# Patient Record
Sex: Female | Born: 1975 | Race: Black or African American | Hispanic: No | Marital: Single | Smoking: Current every day smoker
Health system: Southern US, Community
[De-identification: ages and names within clinical notes are randomized; demographics above are authoritative.]

## PROBLEM LIST (undated history)

## (undated) DIAGNOSIS — A630 Anogenital (venereal) warts: Secondary | ICD-10-CM

## (undated) DIAGNOSIS — F319 Bipolar disorder, unspecified: Secondary | ICD-10-CM

## (undated) DIAGNOSIS — N946 Dysmenorrhea, unspecified: Secondary | ICD-10-CM

## (undated) DIAGNOSIS — G43909 Migraine, unspecified, not intractable, without status migrainosus: Secondary | ICD-10-CM

## (undated) DIAGNOSIS — I7300 Raynaud's syndrome without gangrene: Secondary | ICD-10-CM

## (undated) DIAGNOSIS — F32A Depression, unspecified: Secondary | ICD-10-CM

## (undated) DIAGNOSIS — I10 Essential (primary) hypertension: Secondary | ICD-10-CM

## (undated) DIAGNOSIS — F329 Major depressive disorder, single episode, unspecified: Secondary | ICD-10-CM

## (undated) DIAGNOSIS — F419 Anxiety disorder, unspecified: Secondary | ICD-10-CM

## (undated) DIAGNOSIS — M797 Fibromyalgia: Secondary | ICD-10-CM

## (undated) DIAGNOSIS — E7800 Pure hypercholesterolemia, unspecified: Secondary | ICD-10-CM

## (undated) HISTORY — PX: TUBAL LIGATION: SHX77

## (undated) HISTORY — DX: Anxiety disorder, unspecified: F41.9

## (undated) HISTORY — DX: Major depressive disorder, single episode, unspecified: F32.9

## (undated) HISTORY — DX: Dysmenorrhea, unspecified: N94.6

## (undated) HISTORY — PX: COLPOSCOPY: SHX161

## (undated) HISTORY — DX: Depression, unspecified: F32.A

## (undated) HISTORY — DX: Anogenital (venereal) warts: A63.0

---

## 1999-09-30 HISTORY — PX: CHOLECYSTECTOMY: SHX55

## 2009-07-30 ENCOUNTER — Emergency Department (HOSPITAL_COMMUNITY): Admission: EM | Admit: 2009-07-30 | Discharge: 2009-07-30 | Payer: Self-pay | Admitting: Emergency Medicine

## 2009-08-01 ENCOUNTER — Emergency Department (HOSPITAL_COMMUNITY): Admission: EM | Admit: 2009-08-01 | Discharge: 2009-08-01 | Payer: Self-pay | Admitting: Emergency Medicine

## 2010-10-08 ENCOUNTER — Encounter: Admission: RE | Admit: 2010-10-08 | Discharge: 2010-10-08 | Payer: Self-pay | Admitting: Family Medicine

## 2012-04-24 ENCOUNTER — Emergency Department (HOSPITAL_COMMUNITY)
Admission: EM | Admit: 2012-04-24 | Discharge: 2012-04-24 | Disposition: A | Discharge status: Home / Self Care | Payer: Self-pay | Attending: Emergency Medicine | Admitting: Emergency Medicine

## 2012-04-24 ENCOUNTER — Emergency Department (HOSPITAL_COMMUNITY): Payer: Self-pay

## 2012-04-24 ENCOUNTER — Encounter (HOSPITAL_COMMUNITY): Payer: Self-pay | Admitting: Adult Health

## 2012-04-24 DIAGNOSIS — R42 Dizziness and giddiness: Secondary | ICD-10-CM | POA: Insufficient documentation

## 2012-04-24 DIAGNOSIS — R200 Anesthesia of skin: Secondary | ICD-10-CM

## 2012-04-24 DIAGNOSIS — R209 Unspecified disturbances of skin sensation: Secondary | ICD-10-CM | POA: Insufficient documentation

## 2012-04-24 DIAGNOSIS — R079 Chest pain, unspecified: Secondary | ICD-10-CM | POA: Insufficient documentation

## 2012-04-24 HISTORY — DX: Essential (primary) hypertension: I10

## 2012-04-24 LAB — CBC
HCT: 39.6 % (ref 36.0–46.0)
Hemoglobin: 13.9 g/dL (ref 12.0–15.0)
MCH: 33.7 pg (ref 26.0–34.0)
MCHC: 35.1 g/dL (ref 30.0–36.0)
MCV: 95.9 fL (ref 78.0–100.0)
Platelets: 399 10*3/uL (ref 150–400)
RBC: 4.13 MIL/uL (ref 3.87–5.11)
RDW: 12.3 % (ref 11.5–15.5)
WBC: 8.1 10*3/uL (ref 4.0–10.5)

## 2012-04-24 LAB — GLUCOSE, CAPILLARY: Glucose-Capillary: 95 mg/dL (ref 70–99)

## 2012-04-24 LAB — BASIC METABOLIC PANEL
BUN: 8 mg/dL (ref 6–23)
CO2: 25 mEq/L (ref 19–32)
Calcium: 9.3 mg/dL (ref 8.4–10.5)
Chloride: 101 mEq/L (ref 96–112)
Creatinine, Ser: 0.74 mg/dL (ref 0.50–1.10)
GFR calc Af Amer: >90 mL/min (ref >90)
GFR calc non Af Amer: >90 mL/min (ref >90)
Glucose, Bld: 118 mg/dL — ABNORMAL HIGH (ref 70–99)
Potassium: 4.3 mEq/L (ref 3.5–5.1)
Sodium: 136 mEq/L (ref 135–145)

## 2012-04-24 LAB — POCT I-STAT TROPONIN I: Troponin i, poc: 0 ng/mL (ref 0.00–0.08)

## 2012-04-24 NOTE — Discharge Instructions (Signed)
Chest Pain (Nonspecific) It is often hard to give a specific diagnosis for the cause of chest pain. There is always a chance that your pain could be related to something serious, such as a heart attack or a blood clot in the lungs. You need to follow up with your caregiver for further evaluation. CAUSES   Heartburn.   Pneumonia or bronchitis.   Anxiety or stress.   Inflammation around your heart (pericarditis) or lung (pleuritis or pleurisy).   A blood clot in the lung.   A collapsed lung (pneumothorax). It can develop suddenly on its own (spontaneous pneumothorax) or from injury (trauma) to the chest.   Shingles infection (herpes zoster virus).  The chest wall is composed of bones, muscles, and cartilage. Any of these can be the source of the pain.  The bones can be bruised by injury.   The muscles or cartilage can be strained by coughing or overwork.   The cartilage can be affected by inflammation and become sore (costochondritis).  DIAGNOSIS  Lab tests or other studies, such as X-rays, electrocardiography, stress testing, or cardiac imaging, may be needed to find the cause of your pain.  TREATMENT   Treatment depends on what may be causing your chest pain. Treatment may include:   Acid blockers for heartburn.   Anti-inflammatory medicine.   Pain medicine for inflammatory conditions.   Antibiotics if an infection is present.   You may be advised to change lifestyle habits. This includes stopping smoking and avoiding alcohol, caffeine, and chocolate.   You may be advised to keep your head raised (elevated) when sleeping. This reduces the chance of acid going backward from your stomach into your esophagus.   Most of the time, nonspecific chest pain will improve within 2 to 3 days with rest and mild pain medicine.  HOME CARE INSTRUCTIONS   If antibiotics were prescribed, take your antibiotics as directed. Finish them even if you start to feel better.   For the next few  days, avoid physical activities that bring on chest pain. Continue physical activities as directed.   Do not smoke.   Avoid drinking alcohol.   Only take over-the-counter or prescription medicine for pain, discomfort, or fever as directed by your caregiver.   Follow your caregiver's suggestions for further testing if your chest pain does not go away.   Keep any follow-up appointments you made. If you do not go to an appointment, you could develop lasting (chronic) problems with pain. If there is any problem keeping an appointment, you must call to reschedule.  SEEK MEDICAL CARE IF:   You think you are having problems from the medicine you are taking. Read your medicine instructions carefully.   Your chest pain does not go away, even after treatment.   You develop a rash with blisters on your chest.  SEEK IMMEDIATE MEDICAL CARE IF:   You have increased chest pain or pain that spreads to your arm, neck, jaw, back, or abdomen.   You develop shortness of breath, an increasing cough, or you are coughing up blood.   You have severe back or abdominal pain, feel nauseous, or vomit.   You develop severe weakness, fainting, or chills.   You have a fever.  THIS IS AN EMERGENCY. Do not wait to see if the pain will go away. Get medical help at once. Call your local emergency services (911 in [place] Do not drive yourself to the hospital. MAKE SURE YOU:   Understand these instructions.     Will watch your condition.   Will get help right away if you are not doing well or get worse.  Document Released: 08/25/2005 Document Revised: 11/04/2011 Document Reviewed: 06/20/2008 ExitCare Patient Information 2012 [employer]  RESOURCE GUIDE  Dental Problems  Patients with Medicaid: [facility]                     [facility] [address]                                           [address] Phone:  [phone]                                                   Phone:  [phone]  If unable to pay or uninsured, contact:  [facility] or [facility] to become qualified for the adult dental clinic.  Chronic Pain Problems Contact [facility]  [phone] Patients need to be referred by their primary care doctor.  Insufficient Money for Medicine Contact [employer]:  call "211" or [facility] 271-5999.  No Primary Care Doctor Call [facility]  832-8000 Other agencies that provide inexpensive medical care    [facility]  [phone]    [facility]  [phone]    [facility]  [phone]    [facility]  832-4777    [facility]  [phone]    [facility]  [phone]  Psychological Services [facility]  [phone] [facility]  [phone] [facility]   800 853-5163 (emergency services 641-4993)  Substance Abuse Resources [facility]  336-882-2125 [facility] 336-784-9470 [facility] 336-285-9073 [facility] 336-845-3988 Residential & Outpatient Substance Abuse Program  800-659-3381  Abuse/Neglect [place] Child Abuse Hotline (336) 641-3795 [place] Child Abuse Hotline 800-378-5315 (After Hours)  Emergency Shelter [facility] (336) 271-5985  [facility] (336) 275-9566 [facility] (704) 372-4663  MRSA Hotline #:   832-7006    [place] [facility]  [facility] [place]     [employer]                          [facility] [address] [place]                       [address]      [address]  [place]                                                [place]                            [place] Phone:  [phone]                                     Phone:  [phone]                 Phone:  [phone]  [facility] Phone:   [phone]  [place] Child Abuse Hotline (336) 342-1394 (336) 342-3537 (After Hours)   

## 2012-04-24 NOTE — ED Notes (Addendum)
Reports left arm numbness that radiates to left neck and left face that began last night and worsened this AM at 9 am, pt also complains of tightness in left side of chest that radiates to the mid back and is reproducable with touch  And deep inspiration associated with no appetite. Face symetrical, no drfit or droop, speech clear.

## 2012-04-24 NOTE — ED Notes (Signed)
Pt. Reports left sided pain from "top of my head to my pant line".  States that she woke up and noticed left arm numbness. States that "I just don't feel right".  Pt. Also states she has been under a lot of stress lately.  Reports similar pain in the past due to stress but has never been this bad.

## 2012-04-24 NOTE — ED Notes (Signed)
Pt. Alert and oriented X4, sitting up in bed texting.

## 2012-04-24 NOTE — ED Notes (Signed)
X-ray at bedside

## 2012-04-24 NOTE — ED Notes (Signed)
Dr. Kohut at bedside 

## 2012-04-30 NOTE — ED Provider Notes (Signed)
History    35yF with intermittent numbness of L arm, face and neck. Onset last night. Lasts minutes to up to an hour. Worse today. No appreciable exacerbating or relieving factors. Mild CP. L sided with radiation to back. Worse with respiration. No fever or chills. Denies trauma. No rash. No weakness. NO visual complaints. No n/v. No problems with balance.  CSN: [id number]  Arrival date & time 04/24/12  1620   First MD Initiated Contact with Patient 04/24/12 1733      Chief Complaint  Patient presents with  . Chest Pain  . Numbness  . Headache    (Consider location/radiation/quality/duration/timing/severity/associated sxs/prior treatment) HPI  Past Medical History  Diagnosis Date  . Diabetes mellitus   . Hypertension     History reviewed. No pertinent past surgical history.  History reviewed. No pertinent family history.  History  Substance Use Topics  . Smoking status: Current Everyday Smoker  . Smokeless tobacco: Not on file  . Alcohol Use: No    OB History    Grav Para Term Preterm Abortions TAB SAB Ect Mult Living                  Review of Systems   Review of symptoms negative unless otherwise noted in HPI.   Allergies  Review of patient's allergies indicates no known allergies.  Home Medications  No current outpatient prescriptions on file.  BP 123/96  Pulse 71  Temp(Src) 98.4 F (36.9 C) (Oral)  Resp 12  SpO2 100%  LMP 04/21/2012  Physical Exam  Nursing note and vitals reviewed. Constitutional: She is oriented to person, place, and time. She appears well-developed and well-nourished. No distress.  HENT:  Head: Normocephalic and atraumatic.  Eyes: Conjunctivae are normal. Right eye exhibits no discharge. Left eye exhibits no discharge.  Neck: Neck supple.  Cardiovascular: Normal rate, regular rhythm and normal heart sounds.  Exam reveals no gallop and no friction rub.   No murmur heard. Pulmonary/Chest: Effort normal and breath sounds  normal. No respiratory distress.  Abdominal: Soft. She exhibits no distension. There is no tenderness.  Musculoskeletal: She exhibits no edema and no tenderness.  Neurological: She is alert and oriented to person, place, and time. She displays normal reflexes. No cranial nerve deficit. She exhibits normal muscle tone. Coordination normal.       Good finger to nose. Gait steady  Skin: Skin is warm and dry. She is not diaphoretic.  Psychiatric: She has a normal mood and affect. Her behavior is normal. Thought content normal.    ED Course  Procedures (including critical care time)  Labs Reviewed  BASIC METABOLIC PANEL - Abnormal; Notable for the following:    Glucose, Bld 118 (*)    All other components within normal limits  GLUCOSE, CAPILLARY - Abnormal; Notable for the following:    Glucose-Capillary 110 (*)    All other components within normal limits  CBC  POCT I-STAT TROPONIN I  GLUCOSE, CAPILLARY  LAB REPORT - SCANNED   No results found.  Ct Head Wo Contrast  04/24/2012  *RADIOLOGY REPORT*  Clinical Data: Left arm numbness.  CT HEAD WITHOUT CONTRAST  Technique:  Contiguous axial images were obtained from the base of the skull through the vertex without contrast.  Comparison: None.  Findings: There is no evidence for acute infarction, intracranial hemorrhage, mass lesion, hydrocephalus, or extra-axial fluid. There is no atrophy or white matter disease.  Calvarium is intact. The mastoids and sinuses are clear.  IMPRESSION: Negative  exam.  Original Report Authenticated By: Elsie Stain, M.D.   Chest Portable 1 View  04/24/2012  *RADIOLOGY REPORT*  Clinical Data: Chest pain, diabetic, smoker.  CHEST - 1 VIEW  Comparison:  None.  Findings: The heart size and mediastinal contours are within normal limits.  Both lungs are clear.  IMPRESSION: No active disease.  Original Report Authenticated By: Elsie Stain, M.D.   EKG:  Rhythm: sinus tach Rate: 100 Axis: normal Intervals:  normal ST segments: NS ST changes  1. Chest pain   2. Numbness       MDM  35yF with vague symptoms of numbness. nonfocal exam. CT head neg. EKG nonprovocative and trop wnl. Doubt dissection or pe. Low suspicion for emergent process. Return precautions discussed. Outpt fu.        Raeford Razor, MD 04/30/12 [phone]

## 2013-04-06 ENCOUNTER — Encounter: Payer: Self-pay | Admitting: Obstetrics

## 2013-07-04 ENCOUNTER — Ambulatory Visit: Payer: Self-pay | Admitting: Obstetrics

## 2013-08-19 ENCOUNTER — Emergency Department (HOSPITAL_COMMUNITY)
Admission: EM | Admit: 2013-08-19 | Discharge: 2013-08-19 | Disposition: A | Discharge status: Home / Self Care | Payer: 59 | Attending: Emergency Medicine | Admitting: Emergency Medicine

## 2013-08-19 ENCOUNTER — Encounter (HOSPITAL_COMMUNITY): Payer: Self-pay | Admitting: Emergency Medicine

## 2013-08-19 ENCOUNTER — Emergency Department (HOSPITAL_COMMUNITY): Payer: 59

## 2013-08-19 DIAGNOSIS — I10 Essential (primary) hypertension: Secondary | ICD-10-CM | POA: Insufficient documentation

## 2013-08-19 DIAGNOSIS — Z79899 Other long term (current) drug therapy: Secondary | ICD-10-CM | POA: Insufficient documentation

## 2013-08-19 DIAGNOSIS — R0789 Other chest pain: Secondary | ICD-10-CM | POA: Insufficient documentation

## 2013-08-19 DIAGNOSIS — E7800 Pure hypercholesterolemia, unspecified: Secondary | ICD-10-CM | POA: Insufficient documentation

## 2013-08-19 DIAGNOSIS — F17200 Nicotine dependence, unspecified, uncomplicated: Secondary | ICD-10-CM | POA: Insufficient documentation

## 2013-08-19 DIAGNOSIS — E119 Type 2 diabetes mellitus without complications: Secondary | ICD-10-CM | POA: Insufficient documentation

## 2013-08-19 DIAGNOSIS — R079 Chest pain, unspecified: Secondary | ICD-10-CM

## 2013-08-19 HISTORY — DX: Pure hypercholesterolemia, unspecified: E78.00

## 2013-08-19 LAB — COMPREHENSIVE METABOLIC PANEL
ALT: 9 U/L (ref 0–35)
Alkaline Phosphatase: 80 U/L (ref 39–117)
BUN: 8 mg/dL (ref 6–23)
Calcium: 9.2 mg/dL (ref 8.4–10.5)
Creatinine, Ser: 0.68 mg/dL (ref 0.50–1.10)
GFR calc Af Amer: >90 mL/min (ref >90)
Glucose, Bld: 131 mg/dL — ABNORMAL HIGH (ref 70–99)
Sodium: 133 mEq/L — ABNORMAL LOW (ref 135–145)
Total Protein: 7.9 g/dL (ref 6.0–8.3)

## 2013-08-19 LAB — CBC WITH DIFFERENTIAL/PLATELET
Eosinophils Absolute: 0.4 10*3/uL (ref 0.0–0.7)
Eosinophils Relative: 6 % — ABNORMAL HIGH (ref 0–5)
HCT: 39.3 % (ref 36.0–46.0)
Lymphs Abs: 2.7 10*3/uL (ref 0.7–4.0)
MCH: 32.8 pg (ref 26.0–34.0)
MCV: 96.3 fL (ref 78.0–100.0)
Monocytes Absolute: 0.5 10*3/uL (ref 0.1–1.0)
Monocytes Relative: 7 % (ref 3–12)
Neutro Abs: 3.6 10*3/uL (ref 1.7–7.7)
Platelets: 361 10*3/uL (ref 150–400)
RBC: 4.08 MIL/uL (ref 3.87–5.11)

## 2013-08-19 LAB — LIPASE, BLOOD: Lipase: 24 U/L (ref 11–59)

## 2013-08-19 LAB — TROPONIN I: Troponin I: <0.3 ng/mL (ref <0.30)

## 2013-08-19 MED ORDER — HYDROCODONE-ACETAMINOPHEN 5-325 MG PO TABS: 1.0000 | ORAL_TABLET | ORAL | Status: DC | PRN

## 2013-08-19 MED ORDER — GI COCKTAIL ~~LOC~~
30.0000 mL | Freq: Once | ORAL | Status: AC
  Administered 2013-08-19: 30 mL via ORAL
  Filled 2013-08-19: qty 30

## 2013-08-19 NOTE — ED Notes (Signed)
Pt here with left sided CP x months worse over last 2 weeks with pain into back and abd area

## 2013-08-19 NOTE — ED Provider Notes (Signed)
Medical screening examination/treatment/procedure(s) were performed by non-physician practitioner and as supervising physician I was immediately available for consultation/collaboration.   Junius Argyle, MD 08/19/13 [phone]

## 2013-08-19 NOTE — ED Provider Notes (Signed)
CSN: [id number]     Arrival date & time 08/19/13  1038 History   First MD Initiated Contact with Patient 08/19/13 1111     Chief Complaint  Patient presents with  . Chest Pain   (Consider location/radiation/quality/duration/timing/severity/associated sxs/prior Treatment) Patient is a 37 y.o. female presenting with chest pain. The history is provided by the patient. No language interpreter was used.  Chest Pain Pain location:  L chest Pain quality: sharp   Associated symptoms: abdominal pain and nausea   Associated symptoms: no cough, no fever, no shortness of breath and not vomiting   Associated symptoms comment:  Sharp chest pain that started suddenly while in the shower this morning affecting the left side of her chest. It has been continuous, now with pain in the epigastric and LUQ abdomen. She reports nausea without vomiting. No recent fever. She states she has had this pain intermittently in the past months but this was the most intense episode.    Past Medical History  Diagnosis Date  . Diabetes mellitus   . Hypertension   . Hypercholesteremia    History reviewed. No pertinent past surgical history. History reviewed. No pertinent family history. History  Substance Use Topics  . Smoking status: Current Every Day Smoker  . Smokeless tobacco: Not on file  . Alcohol Use: No   OB History   Grav Para Term Preterm Abortions TAB SAB Ect Mult Living                 Review of Systems  Constitutional: Negative for fever.  Respiratory: Negative for cough and shortness of breath.   Cardiovascular: Positive for chest pain.  Gastrointestinal: Positive for nausea and abdominal pain. Negative for vomiting.  Genitourinary: Negative.   Musculoskeletal: Negative.   Skin: Negative.   Neurological: Negative.     Allergies  Review of patient's allergies indicates no known allergies.  Home Medications   Current Outpatient Rx  Name  Route  Sig  Dispense  Refill  . baclofen  (LIORESAL) 10 MG tablet   Oral   Take 10 mg by mouth 3 times/day as needed-between meals & bedtime (for back pain).         Marland Kitchen lisinopril (PRINIVIL,ZESTRIL) 10 MG tablet   Oral   Take 20 mg by mouth daily.         . metFORMIN (GLUCOPHAGE) 500 MG tablet   Oral   Take 500 mg by mouth 2 (two) times daily with a meal.         . naproxen sodium (ANAPROX) 220 MG tablet   Oral   Take 220 mg by mouth 2 (two) times daily with a meal.         . omeprazole (PRILOSEC) 20 MG capsule   Oral   Take 20 mg by mouth daily.         . pravastatin (PRAVACHOL) 80 MG tablet   Oral   Take 80 mg by mouth daily.         . Probiotic Product (PROBIOTIC DAILY PO)   Oral   Take 1 capsule by mouth daily.          BP 134/89  Pulse 84  Temp(Src) 98.4 F (36.9 C) (Oral)  Resp 18  SpO2 98% Physical Exam  Constitutional: She is oriented to person, place, and time. She appears well-developed and well-nourished.  HENT:  Head: Normocephalic.  Neck: Normal range of motion. Neck supple.  Cardiovascular: Normal rate and regular rhythm.   Pulmonary/Chest: Effort  normal and breath sounds normal. She has no wheezes. She has no rales. She exhibits no tenderness.  Abdominal: Soft. Bowel sounds are normal. There is no tenderness. There is no rebound and no guarding.  Musculoskeletal: Normal range of motion. She exhibits no edema.  Neurological: She is alert and oriented to person, place, and time.  Skin: Skin is warm and dry. No rash noted.  Psychiatric: She has a normal mood and affect.    ED Course  Procedures (including critical care time) Labs Review Labs Reviewed  TROPONIN I  CBC WITH DIFFERENTIAL  COMPREHENSIVE METABOLIC PANEL  LIPASE, BLOOD   Results for orders placed during the hospital encounter of 08/19/13  TROPONIN I      Result Value Range   Troponin I <0.30  <0.30 ng/mL  CBC WITH DIFFERENTIAL      Result Value Range   WBC 7.2  4.0 - 10.5 K/uL   RBC 4.08  3.87 - 5.11 MIL/uL    Hemoglobin 13.4  12.0 - 15.0 g/dL   HCT 95.2  84.1 - 32.4 %   MCV 96.3  78.0 - 100.0 fL   MCH 32.8  26.0 - 34.0 pg   MCHC 34.1  30.0 - 36.0 g/dL   RDW 40.1  02.7 - 25.3 %   Platelets 361  150 - 400 K/uL   Neutrophils Relative % 50  43 - 77 %   Neutro Abs 3.6  1.7 - 7.7 K/uL   Lymphocytes Relative 37  12 - 46 %   Lymphs Abs 2.7  0.7 - 4.0 K/uL   Monocytes Relative 7  3 - 12 %   Monocytes Absolute 0.5  0.1 - 1.0 K/uL   Eosinophils Relative 6 (*) 0 - 5 %   Eosinophils Absolute 0.4  0.0 - 0.7 K/uL   Basophils Relative 1  0 - 1 %   Basophils Absolute 0.0  0.0 - 0.1 K/uL  COMPREHENSIVE METABOLIC PANEL      Result Value Range   Sodium 133 (*) 135 - 145 mEq/L   Potassium 4.4  3.5 - 5.1 mEq/L   Chloride 99  96 - 112 mEq/L   CO2 25  19 - 32 mEq/L   Glucose, Bld 131 (*) 70 - 99 mg/dL   BUN 8  6 - 23 mg/dL   Creatinine, Ser 6.64  0.50 - 1.10 mg/dL   Calcium 9.2  8.4 - 40.3 mg/dL   Total Protein 7.9  6.0 - 8.3 g/dL   Albumin 3.7  3.5 - 5.2 g/dL   AST 15  0 - 37 U/L   ALT 9  0 - 35 U/L   Alkaline Phosphatase 80  39 - 117 U/L   Total Bilirubin 0.3  0.3 - 1.2 mg/dL   GFR calc non Af Amer >90  >90 mL/min   GFR calc Af Amer >90  >90 mL/min  LIPASE, BLOOD      Result Value Range   Lipase 24  11 - 59 U/L   Dg Chest 2 View  08/19/2013   *RADIOLOGY REPORT*  Clinical Data: Chest pain  CHEST - 2 VIEW  Comparison: Apr 24, 2012  Findings: There is no focal infiltrate, pulmonary edema, or pleural effusion.  Mediastinal contour and cardiac silhouette are normal. The soft tissues and osseous structures are normal.  IMPRESSION: No acute cardiopulmonary disease identified.   Original Report Authenticated By: Sherian Rein, M.D.   Imaging Review No results found.  MDM  No diagnosis found. 1.  Chest pain  The chest pain is atypical presentation of cardiac symptoms - constant, sharp, radiation into epigastrium and LUQ. She has HTN and high cholesterol, but is felt low risk for ACS today. Nml EKG,  CXR, neg trop.. Stable for discharge.     Arnoldo Hooker, PA-C 08/19/13 1511

## 2013-08-19 NOTE — ED Notes (Signed)
Shari PA at bedside   

## 2013-08-19 NOTE — ED Notes (Signed)
Pt has been having intermittent central cp for a few months. Pt states she has been to pcp for pain and they have been referring her to rheumatologist to treat for fibromyalgia. Pt states her tx has not been working and over the last two weeks pain has been different from normal she has been having sob, and back pain. Pt currently rates pain 6/10 and describes pain as pressure.

## 2013-08-19 NOTE — ED Notes (Signed)
Discharge and follow up instructions reviewed. Pt verbalized understanding.  

## 2014-07-05 ENCOUNTER — Emergency Department (HOSPITAL_COMMUNITY)
Admission: EM | Admit: 2014-07-05 | Discharge: 2014-07-05 | Disposition: A | Discharge status: Home / Self Care | Payer: Managed Care, Other (non HMO) | Attending: Emergency Medicine | Admitting: Emergency Medicine

## 2014-07-05 ENCOUNTER — Encounter (HOSPITAL_COMMUNITY): Payer: Self-pay | Admitting: Emergency Medicine

## 2014-07-05 DIAGNOSIS — S40269A Insect bite (nonvenomous) of unspecified shoulder, initial encounter: Secondary | ICD-10-CM | POA: Insufficient documentation

## 2014-07-05 DIAGNOSIS — W57XXXA Bitten or stung by nonvenomous insect and other nonvenomous arthropods, initial encounter: Secondary | ICD-10-CM

## 2014-07-05 DIAGNOSIS — E7800 Pure hypercholesterolemia, unspecified: Secondary | ICD-10-CM | POA: Insufficient documentation

## 2014-07-05 DIAGNOSIS — I10 Essential (primary) hypertension: Secondary | ICD-10-CM | POA: Insufficient documentation

## 2014-07-05 DIAGNOSIS — E119 Type 2 diabetes mellitus without complications: Secondary | ICD-10-CM | POA: Insufficient documentation

## 2014-07-05 DIAGNOSIS — Z791 Long term (current) use of non-steroidal anti-inflammatories (NSAID): Secondary | ICD-10-CM | POA: Insufficient documentation

## 2014-07-05 DIAGNOSIS — Y9289 Other specified places as the place of occurrence of the external cause: Secondary | ICD-10-CM | POA: Insufficient documentation

## 2014-07-05 DIAGNOSIS — Y9389 Activity, other specified: Secondary | ICD-10-CM | POA: Insufficient documentation

## 2014-07-05 DIAGNOSIS — F17200 Nicotine dependence, unspecified, uncomplicated: Secondary | ICD-10-CM | POA: Insufficient documentation

## 2014-07-05 DIAGNOSIS — Z79899 Other long term (current) drug therapy: Secondary | ICD-10-CM | POA: Insufficient documentation

## 2014-07-05 MED ORDER — HYDROXYZINE HCL 25 MG PO TABS: 25.0000 mg | ORAL_TABLET | Freq: Four times a day (QID) | ORAL | Status: DC

## 2014-07-05 MED ORDER — HYDROXYZINE HCL 25 MG PO TABS
25.0000 mg | ORAL_TABLET | Freq: Once | ORAL | Status: AC
  Administered 2014-07-05: 25 mg via ORAL
  Filled 2014-07-05: qty 1

## 2014-07-05 MED ORDER — SULFAMETHOXAZOLE-TRIMETHOPRIM 800-160 MG PO TABS: 1.0000 | ORAL_TABLET | Freq: Two times a day (BID) | ORAL | Status: DC

## 2014-07-05 NOTE — ED Provider Notes (Signed)
CSN: [id number]     Arrival date & time 07/05/14  [id number] History   First MD Initiated Contact with Patient 07/05/14 0945     Chief Complaint  Patient presents with  . Insect Bite     (Consider location/radiation/quality/duration/timing/severity/associated sxs/prior Treatment) Patient is a 38 y.o. female presenting with animal bite. The history is provided by the patient.  Animal Bite Contact animal:  Insect Location:  Shoulder/arm Shoulder/arm injury location:  R upper arm Time since incident:  6 days Pain details:    Quality:  Aching and itching   Severity:  Mild   Timing:  Constant   Progression:  Worsening Incident location:  Home Provoked: unprovoked   Relieved by:  Nothing Worsened by:  Nothing tried Associated symptoms: rash   Associated symptoms: no fever, no numbness and no swelling     Past Medical History  Diagnosis Date  . Diabetes mellitus   . Hypertension   . Hypercholesteremia    History reviewed. No pertinent past surgical history. History reviewed. No pertinent family history. History  Substance Use Topics  . Smoking status: Current Every Day Smoker  . Smokeless tobacco: Not on file  . Alcohol Use: No   OB History   Grav Para Term Preterm Abortions TAB SAB Ect Mult Living                 Review of Systems  Constitutional: Negative for fever.  Skin: Positive for rash.  Neurological: Negative for numbness.  All other systems reviewed and are negative.     Allergies  Review of patient's allergies indicates no known allergies.  Home Medications   Prior to Admission medications   Medication Sig Start Date End Date Taking? Authorizing Provider  baclofen (LIORESAL) 10 MG tablet Take 10 mg by mouth 3 times/day as needed-between meals & bedtime (for back pain).    Historical Provider, MD  HYDROcodone-acetaminophen (NORCO/VICODIN) 5-325 MG per tablet Take 1 tablet by mouth every 4 (four) hours as needed for pain. 08/19/13   Shari A Upstill, PA-C   lisinopril (PRINIVIL,ZESTRIL) 10 MG tablet Take 20 mg by mouth daily.    Historical Provider, MD  metFORMIN (GLUCOPHAGE) 500 MG tablet Take 500 mg by mouth 2 (two) times daily with a meal.    Historical Provider, MD  naproxen sodium (ANAPROX) 220 MG tablet Take 220 mg by mouth 2 (two) times daily with a meal.    Historical Provider, MD  omeprazole (PRILOSEC) 20 MG capsule Take 20 mg by mouth daily.    Historical Provider, MD  pravastatin (PRAVACHOL) 80 MG tablet Take 80 mg by mouth daily.    Historical Provider, MD  Probiotic Product (PROBIOTIC DAILY PO) Take 1 capsule by mouth daily.    Historical Provider, MD   BP 124/99  Pulse 88  Temp(Src) 98.4 F (36.9 C) (Oral)  Resp 16  SpO2 100% Physical Exam  Nursing note and vitals reviewed. Constitutional: She is oriented to person, place, and time. She appears well-developed and well-nourished. No distress.  HENT:  Head: Normocephalic and atraumatic.  Mouth/Throat: Oropharynx is clear and moist.  Eyes: EOM are normal. Pupils are equal, round, and reactive to light.  Neck: Normal range of motion. Neck supple.  Cardiovascular: Normal rate and regular rhythm.  Exam reveals no friction rub.   No murmur heard. Pulmonary/Chest: Effort normal and breath sounds normal. No respiratory distress. She has no wheezes. She has no rales.  Abdominal: Soft. She exhibits no distension. There is no tenderness. There  is no rebound.  Musculoskeletal: Normal range of motion. She exhibits no edema.  Neurological: She is alert and oriented to person, place, and time.  Skin: Rash (3 small ringed lesions with mild erythema, indurated, no surrounding cellulitis. Mild bruising in center of lesions) noted. She is not diaphoretic.    ED Course  Procedures (including critical care time) Labs Review Labs Reviewed - No data to display  Imaging Review No results found.   EKG Interpretation None      MDM   Final diagnoses:  Bug bites    38 year old female  here with rash. 3 small indurated red circles on her a right upper arm. Mild bruising and purplish color in the center. No fluctuance. Ascending cellulitis. No evidence of abscess. Concern for mild infection due to worsening of the lesions over the past 6 days. Persistent itching as well. Will give Vistaril and Bactrim for home. No concern for right on fever. No lymphadenopathy in her axilla. Instructed to follow up w/ PCP.    Elwin Mocha, MD 07/05/14 [phone]

## 2014-07-05 NOTE — ED Notes (Signed)
Pt alert and oriented x4. Respirations even and unlabored, bilateral symmetrical rise and fall of chest. Skin warm and dry. In no acute distress. Denies needs.   

## 2014-07-05 NOTE — Discharge Instructions (Signed)
Insect Bite °Mosquitoes, flies, fleas, bedbugs, and many other insects can bite. Insect bites are different from insect stings. A sting is when venom is injected into the skin. Some insect bites can transmit infectious diseases. °SYMPTOMS  °Insect bites usually turn red, swell, and itch for 2 to 4 days. They often go away on their own. °TREATMENT  °Your caregiver may prescribe antibiotic medicines if a bacterial infection develops in the bite. °HOME CARE INSTRUCTIONS °· Do not scratch the bite area. °· Keep the bite area clean and dry. Wash the bite area thoroughly with soap and water. °· Put ice or cool compresses on the bite area. °¨ Put ice in a plastic bag. °¨ Place a towel between your skin and the bag. °¨ Leave the ice on for 20 minutes, 4 times a day for the first 2 to 3 days, or as directed. °· You may apply a baking soda paste, cortisone cream, or calamine lotion to the bite area as directed by your caregiver. This can help reduce itching and swelling. °· Only take over-the-counter or prescription medicines as directed by your caregiver. °· If you are given antibiotics, take them as directed. Finish them even if you start to feel better. °You may need a tetanus shot if: °· You cannot remember when you had your last tetanus shot. °· You have never had a tetanus shot. °· The injury broke your skin. °If you get a tetanus shot, your arm may swell, get red, and feel warm to the touch. This is common and not a problem. If you need a tetanus shot and you choose not to have one, there is a rare chance of getting tetanus. Sickness from tetanus can be serious. °SEEK IMMEDIATE MEDICAL CARE IF:  °· You have increased pain, redness, or swelling in the bite area. °· You see a red line on the skin coming from the bite. °· You have a fever. °· You have joint pain. °· You have a headache or neck pain. °· You have unusual weakness. °· You have a rash. °· You have chest pain or shortness of breath. °· You have abdominal pain,  nausea, or vomiting. °· You feel unusually tired or sleepy. °MAKE SURE YOU:  °· Understand these instructions. °· Will watch your condition. °· Will get help right away if you are not doing well or get worse. °Document Released: 12/23/2004 Document Revised: 02/07/2012 Document Reviewed: 06/16/2011 °ExitCare® Patient Information ©2015 [employer]. This information is not intended to replace advice given to you by your health care provider. Make sure you discuss any questions you have with your health care provider. ° ° °Emergency Department Resource Guide °1) Find a Doctor and Pay Out of Pocket °Although you won't have to find out who is covered by your insurance plan, it is a good idea to ask around and get recommendations. You will then need to call the office and see if the doctor you have chosen will accept you as a new patient and what types of options they offer for patients who are self-pay. Some doctors offer discounts or will set up payment plans for their patients who do not have insurance, but you will need to ask so you aren't surprised when you get to your appointment. ° °2) Contact Your Local Health Department °Not all health departments have doctors that can see patients for sick visits, but many do, so it is worth a call to see if yours does. If you don't know where your local   health department is, you can check in your phone book. The CDC also has a tool to help you locate your state's health department, and many state websites also have listings of all of their local health departments. ° °3) Find a Walk-in Clinic °If your illness is not likely to be very severe or complicated, you may want to try a walk in clinic. These are popping up all over the country in pharmacies, drugstores, and shopping centers. They're usually staffed by nurse practitioners or physician assistants that have been trained to treat common illnesses and complaints. They're usually fairly quick and inexpensive. However, if  you have serious medical issues or chronic medical problems, these are probably not your best option. ° °No Primary Care Doctor: °- Call [facility] at  832-8000 - they can help you locate a primary care doctor that  accepts your insurance, provides certain services, etc. °- Physician Referral Service- 1-800-533-3463 ° °Chronic Pain Problems: °Organization         Address  Phone   Notes  °[facility]  (336) 297-2271 Patients need to be referred by their primary care doctor.  ° °Medication Assistance: °Organization         Address  Phone   Notes  °[place] [facility] [address] °[place], [place] [address] (336) 641-8030 --Must be a resident of [place] °-- Must have NO insurance coverage whatsoever (no Medicaid/ Medicare, etc.) °-- The pt. MUST have a primary care doctor that directs their care regularly and follows them in the community °  °[facility]  (866) 331-1348   °[employer]  (888) 892-1162   ° °Agencies that provide inexpensive medical care: °Organization         Address  Phone   Notes  °[facility]  (336) 832-8035   °[facility]    (336) 832-7272   °[facility] [address] °[place], [place] [address] (336) 832-4777   °[facility] [address], °[place] (336) 271-4999   °[facility]    (336) 373-0678   °[facility]    (336) 272-1050   °[facility] ° [address], [place] Phone:  (336) 832-4444, Fax:  (336) 832-4440 Hours of Operation:  9 am - 6 pm, M-F.  Also accepts Medicaid/Medicare and self-pay.  °[facility] ° [address], [place] Phone: (336) 832-3150, Fax: (336) 832-3151. Hours of Operation:  8:30 am - 5:30 pm, M-F.  Also accepts Medicaid and self-pay.  °[facility] [place] [address], [place] Phone: (336) 878-6027   °[facility] [address], [place], [place] (336)723-1848, Ext. 123 Mondays & Thursdays: 7-9 AM.  First 15 patients are seen on a first come, first serve basis. °  ° °Medicaid-accepting [place] Providers: ° °Organization         Address  Phone   Notes  °[facility] [address], [place] (336) 641-2100 Also accepts self-pay patients.  °[facility] [address], [place] ° (336) 856-9996   °[facility] [address], [place] (336) 288-8857   °[facility] [address] [place] [address], [place] (336) 299-7000   °Veita Bland [address], [place]  ° (336) 373-1557 Only accepts [place] Access Medicaid patients after they   have their name applied to their card.  ° °Self-Pay (no insurance) in [place]: ° °Organization         Address  Phone   Notes  °Sickle Cell Patients, [facility] [address], [place] (336) 832-1970   °[facility] [address], [place] (336) 832-4400   °[facility] [place] [facility] ° [address], [place] (336) 992-4800   °[facility]/Dr. Osei-Bonsu ° [address] [place] [address], [place] or [address], [place] (336) 841-8500 Phone number for both [place] and [place] locations is the same.  °[facility] [address], [place] (336) 299-0000   °[facility] [address] [place] [address], [place] or [address] (336) 852-7530 °(336) 878-2260   °[facility] [address], [place] (336) 350-1642, phone; (336) 294-5005, fax Sees patients 1st and 3rd Saturday of every month.  Must not qualify for public or private insurance (i.e. Medicaid, Medicare, [place] Health Choice, Veterans' Benefits) • Household income should be no more than 200% of the poverty level •The clinic cannot treat you if you are pregnant or think you are  pregnant • Sexually transmitted diseases are not treated at the clinic.  ° ° °Dental Care: °Organization         Address  Phone  Notes  °[facility] [address], [place] (336) 641-6152 Accepts children up to age 21 who are enrolled in Medicaid or [place] Health Choice; pregnant women with a Medicaid card; and children who have applied for Medicaid or [place] Health Choice, but were declined, whose parents can pay a reduced fee at time of service.  °[facility] [place]  [address], [place] (336) 641-7733 Accepts children up to age 21 who are enrolled in Medicaid or [place] Health Choice; pregnant women with a Medicaid card; and children who have applied for Medicaid or [place] Health Choice, but were declined, whose parents can pay a reduced fee at time of service.  °[facility] ° [address], [place] (336) 641-4533 Patients are seen by appointment only. Walk-ins are not accepted. [facility] will see patients 18 years of age and older. °Monday - Tuesday (8am-5pm) °Most Wednesdays (8:30-5pm) °$30 per visit, cash only  °[facility] ° [address], [place] (336) 641-4533 Patients are seen by appointment only. Walk-ins are not accepted. [facility] will see patients 18 years of age and older. °One Wednesday Evening (Monthly: Volunteer Based).  $30 per visit, cash only  °[facility]  (919) 537-3737 for adults; Children under age 4, call Graduate Pediatric Dentistry at (919) [phone]. Children aged 4-14, please call (919) [phone] to request a pediatric application. ° Dental services are provided in all areas of dental care including fillings, crowns and bridges, complete and partial dentures, implants, gum treatment, root canals, and extractions. Preventive care is also provided. Treatment is provided to both adults and  children. °Patients are selected via a lottery and there is often a waiting list. °  °[facility] [address], °[place] ° (336) 763-8833 [email or web address] °  °[facility] [address], [place], [place] (336)723-1848, Ext. 123 Second and Fourth Thursday of each month, opens at 6:30 AM; Clinic ends at 9 AM.  Patients are seen   on a first-come first-served basis, and a limited number are seen during each clinic.  ° °[facility] ° [address], [place], [place] (336) 723-7904   Eligibility Requirements °You must have lived in [place], [place], or [place] counties for at least the last three months. °  You cannot be eligible for state or federal sponsored healthcare insurance, including Veterans Administration, Medicaid, or Medicare. °  You generally cannot be eligible for healthcare insurance through your employer.  °  How to apply: °Eligibility screenings are held every Tuesday and Wednesday afternoon from 1:00 pm until 4:00 pm. You do not need an appointment for the interview!  °[facility] [address], [place], [place] 336-631-2330   °[facility]  336-342-8273   °[facility]  336-703-3100   °[facility]  336-570-6415   ° °Behavioral Health Resources in the Community: °Intensive Outpatient Programs °Organization         Address  Phone  Notes  °[facility] [address], [place], [place] 336-878-6098   °[facility] [address], [place], [place] 336-832-9800   °[facility] [address], [place], [place] ° 336-882-2125   °[facility] [address],  °[place], [place] 1-800-853-5163 or 336-641-4981   °Substance Abuse Resources °Organization         Address  Phone  Notes  °[facility]  336-882-2125   °[facility]  336-784-9470   °[facility]  336-285-9073    °[facility]  336-845-3988   °Residential & Outpatient Substance Abuse Program  1-800-659-3381   °Psychological Services °Organization         Address  Phone  Notes  °[facility]  336- 832-9600   °[facility]  336- 378-7881   °[facility] [address], [place] 1-800-853-5163 or 336-641-4981   ° °Mobile Crisis Teams °Organization         Address  Phone  Notes  °[facility], Mobile Crisis Care Unit  1-877-626-1772   °Assertive °[facility] ° [address] [place], [place] 336-834-9664   °Sharon DeEsch [address] °[place] 336-554-5454   ° °Self-Help/Support Groups °Organization         Address  Phone             Notes  °[facility] - variety of support groups  336- 373-1402 Call for more information  °Narcotics Anonymous (NA), [facility] [address], °[place]  2 meetings at this location  ° °Residential Treatment Programs °Organization         Address  Phone  Notes  °[facility] [address],    °[place]  1-866-801-8205   °[facility] ° [address], [place], [place] 704-293-8524   °[facility] [address], [place] 336-845-3988 Admissions: 8am-3pm M-F  °[facility] [address]    °[place], [place] 336-841-1104   °[facility] [address], [place], [place] 336-379-7146   °[facility] [address]  °, [place] 336-285-9073   °[facility] - Intensive Outpatient [address], , [place] 336-852-3033   °[facility] [address]  °[place], [place] 1-877-615-2722 or 336-784-9470   °[facility] [address] [place], [place]   336-227-7417 Accepts Medicaid  °[facility] [address]  °[place] 1-800-659-3381 Substance Abuse/Addiction Treatment  ° °[place]  Behavioral Health Resources °Organization         Address  Phone  Notes  °[facility]  (888) 581-9988   °Julie Brannon, PhD [address] [place], [place]   (336) 349-5553 or (336) 951-0000   °[facility]   [address] °[place], [place] (336) 349-4454   °[facility] [address], [place], [place] (336) 342-8316 Insurance/Medicaid/sponsorship through [facility]  °[facility] [address]                                    [place], [place] (336) 342-8316 Therapy/tele-psych/case  °[facility] [address]  ° [place], [place] (336) 349-2233    °Dr. Arfeen  (336) 349-4544   °[facility] [place]  [employer] [place] [facility] 1) [address], [place] °2) [address], [place] °3)  [address], [place] (336) 349-3220 °(336) 342-7768 ° °(336) 342-8140   °[place] [facility] (336) 342-1394 or (336) 342-3537 (After Hours)    ° ° ° ° °

## 2014-07-05 NOTE — ED Notes (Signed)
Pt reports she was bitten by a bug 1 week ago, has 3 red inflamed spots around right elbow. Initially just itching, but now pain 4/10.

## 2014-07-05 NOTE — ED Notes (Signed)
Pt escorted to discharge window. Pt verbalized understanding discharge instructions. In no acute distress.  

## 2014-10-09 ENCOUNTER — Encounter (HOSPITAL_COMMUNITY): Payer: Self-pay | Admitting: Emergency Medicine

## 2014-10-09 ENCOUNTER — Emergency Department (HOSPITAL_COMMUNITY)
Admission: EM | Admit: 2014-10-09 | Discharge: 2014-10-09 | Disposition: A | Discharge status: Home / Self Care | Payer: Managed Care, Other (non HMO) | Attending: Emergency Medicine | Admitting: Emergency Medicine

## 2014-10-09 ENCOUNTER — Emergency Department (HOSPITAL_COMMUNITY): Payer: Managed Care, Other (non HMO)

## 2014-10-09 DIAGNOSIS — E780 Pure hypercholesterolemia: Secondary | ICD-10-CM | POA: Insufficient documentation

## 2014-10-09 DIAGNOSIS — R52 Pain, unspecified: Secondary | ICD-10-CM

## 2014-10-09 DIAGNOSIS — M797 Fibromyalgia: Secondary | ICD-10-CM | POA: Insufficient documentation

## 2014-10-09 DIAGNOSIS — I10 Essential (primary) hypertension: Secondary | ICD-10-CM | POA: Insufficient documentation

## 2014-10-09 DIAGNOSIS — E119 Type 2 diabetes mellitus without complications: Secondary | ICD-10-CM | POA: Insufficient documentation

## 2014-10-09 DIAGNOSIS — M62830 Muscle spasm of back: Secondary | ICD-10-CM

## 2014-10-09 DIAGNOSIS — M546 Pain in thoracic spine: Secondary | ICD-10-CM | POA: Insufficient documentation

## 2014-10-09 DIAGNOSIS — R109 Unspecified abdominal pain: Secondary | ICD-10-CM | POA: Insufficient documentation

## 2014-10-09 DIAGNOSIS — Z7982 Long term (current) use of aspirin: Secondary | ICD-10-CM | POA: Insufficient documentation

## 2014-10-09 DIAGNOSIS — Z79899 Other long term (current) drug therapy: Secondary | ICD-10-CM | POA: Insufficient documentation

## 2014-10-09 DIAGNOSIS — Z720 Tobacco use: Secondary | ICD-10-CM | POA: Insufficient documentation

## 2014-10-09 HISTORY — DX: Fibromyalgia: M79.7

## 2014-10-09 MED ORDER — CYCLOBENZAPRINE HCL 10 MG PO TABS: 10.0000 mg | ORAL_TABLET | Freq: Two times a day (BID) | ORAL | Status: DC | PRN

## 2014-10-09 NOTE — ED Notes (Signed)
Pt presents with mid back spasms radiating up and down spine. Pt also reports epigastric pain with nausea and inability to take deep breath without invoking pain. +nausea

## 2014-10-09 NOTE — ED Provider Notes (Signed)
CSN: [id number]     Arrival date & time 10/09/14  0247 History   First MD Initiated Contact with Patient 10/09/14 [phone]     Chief Complaint  Patient presents with  . Back Pain  . Abdominal Pain     (Consider location/radiation/quality/duration/timing/severity/associated sxs/prior Treatment) Patient is a 38 y.o. female presenting with back pain and abdominal pain. The history is provided by the patient. No language interpreter was used.  Back Pain Location:  Thoracic spine Quality:  Stabbing Radiates to: left greater than right thoracic back pain that radiates aroudn to upper quadrant abdomen.  Pain severity:  Moderate Duration:  1 day Timing:  Sporadic Associated symptoms: abdominal pain   Associated symptoms: no chest pain, no dysuria, no fever and no numbness   Associated symptoms comment:  Pain that is sharp and grabbing that occurs with certain movements, and is resolved with rest. No SOB or cough, but she reports increased pain in her back with deep breathing. No N, V, D or change in bowel habits. No fever. 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 She denies any known injury. Abdominal Pain Associated symptoms: no chest pain, no chills, no cough, no dysuria, no fever, no nausea, no shortness of breath and no vomiting     Past Medical History  Diagnosis Date  . Diabetes mellitus   . Hypertension   . Hypercholesteremia   . Fibromyalgia    Past Surgical History  Procedure Laterality Date  . Cesarean section     No family history on file. History  Substance Use Topics  . Smoking status: Current Every Day Smoker  . Smokeless tobacco: Not on file  . Alcohol Use: No   OB History    No data available     Review of Systems  Constitutional: Negative for fever and chills.  Respiratory: Negative.  Negative for cough and shortness of breath.   Cardiovascular: Negative.  Negative for chest pain.  Gastrointestinal: Positive for abdominal pain.  Negative for nausea and vomiting.  Genitourinary: Negative for dysuria.  Musculoskeletal: Positive for back pain.       See HPI.  Skin: Negative.   Neurological: Negative.  Negative for numbness.      Allergies  Review of patient's allergies indicates no known allergies.  Home Medications   Prior to Admission medications   Medication Sig Start Date End Date Taking? Authorizing Provider  aspirin-acetaminophen-caffeine (EXCEDRIN MIGRAINE) [id number] MG per tablet Take 1 tablet by mouth every 6 (six) hours as needed for headache.   Yes Historical Provider, MD  gabapentin (NEURONTIN) 300 MG capsule Take 300 mg by mouth 3 (three) times daily.   Yes Historical Provider, MD  ibuprofen (ADVIL,MOTRIN) 200 MG tablet Take 800 mg by mouth every 6 (six) hours as needed for moderate pain.   Yes Historical Provider, MD  metFORMIN (GLUCOPHAGE) 500 MG tablet Take 500 mg by mouth 2 (two) times daily with a meal.   Yes Historical Provider, MD  baclofen (LIORESAL) 10 MG tablet Take 10 mg by mouth 3 times/day as needed-between meals & bedtime (for back pain).    Historical Provider, MD  HYDROcodone-acetaminophen (NORCO/VICODIN) 5-325 MG per tablet Take 1 tablet by mouth every 4 (four) hours as needed for pain. 08/19/13   Taeveon Keesling A Ricki Clack, PA-C  hydrOXYzine (ATARAX/VISTARIL) 25 MG tablet Take 1 tablet (25 mg total) by mouth every 6 (six) hours. 07/05/14   Elwin MochaBlair Walden, MD  lisinopril (PRINIVIL,ZESTRIL) 10 MG tablet  Take 20 mg by mouth daily.    Historical Provider, MD  naproxen sodium (ANAPROX) 220 MG tablet Take 220 mg by mouth 2 (two) times daily with a meal.    Historical Provider, MD  omeprazole (PRILOSEC) 20 MG capsule Take 20 mg by mouth daily.    Historical Provider, MD  pravastatin (PRAVACHOL) 80 MG tablet Take 80 mg by mouth daily.    Historical Provider, MD  Probiotic Product (PROBIOTIC DAILY PO) Take 1 capsule by mouth daily.    Historical Provider, MD  sulfamethoxazole-trimethoprim (BACTRIM DS)  800-160 MG per tablet Take 1 tablet by mouth 2 (two) times daily. 07/05/14   Elwin MochaBlair Walden, MD   BP 123/94 mmHg  Pulse 94  Temp(Src) 97.7 F (36.5 C) (Oral)  Resp 16  Ht 5\' 4"  (1.626 m)  Wt 212 lb (96.163 kg)  BMI 36.37 kg/m2  SpO2 100%  LMP 09/25/2014 Physical Exam  Constitutional: She is oriented to person, place, and time. She appears well-developed and well-nourished.  HENT:  Head: Normocephalic.  Neck: Normal range of motion. Neck supple.  Cardiovascular: Normal rate and regular rhythm.   Pulmonary/Chest: Effort normal and breath sounds normal.  Abdominal: Soft. Bowel sounds are normal. There is no tenderness. There is no rebound and no guarding.  Musculoskeletal: Normal range of motion.  Left lateral thoracic back tender without palpable spasm.   Neurological: She is alert and oriented to person, place, and time.  Skin: Skin is warm and dry. No rash noted.  Psychiatric: She has a normal mood and affect.    ED Course  Procedures (including critical care time) Labs Review Labs Reviewed - No data to display  Imaging Review Dg Chest 2 View  10/09/2014   CLINICAL DATA:  Worsening upper back pain over the past 24 hr. No known injury.  EXAM: CHEST  2 VIEW  COMPARISON:  PA and lateral chest 08/19/2013.  FINDINGS: Heart size and mediastinal contours are within normal limits. Both lungs are clear. Visualized skeletal structures are unremarkable.  IMPRESSION: Negative exam.   Electronically Signed   By: Drusilla Kannerhomas  Dalessio M.D.   On: 10/09/2014 04:15     EKG Interpretation None      MDM   Final diagnoses:  Pain  1. Muscular spasm of back  Symptoms follow pattern of muscular spasm - sharp grabbing, positional, resolved with rest. CXR neg. No evidence to suggest cardiac involvement, pulmonary abnormality or abdominal pathology.    Arnoldo HookerShari A Tyreanna Bisesi, PA-C 10/09/14 [id number]  Olivia Mackielga M Otter, MD 10/09/14 [phone]

## 2014-10-09 NOTE — ED Notes (Signed)
Back from XRay

## 2014-10-09 NOTE — ED Notes (Signed)
Patient transported to X-ray 

## 2014-10-09 NOTE — Discharge Instructions (Signed)
Heat Therapy °Heat therapy can help ease sore, stiff, injured, and tight muscles and joints. Heat relaxes your muscles, which may help ease your pain.  °RISKS AND COMPLICATIONS °If you have any of the following conditions, do not use heat therapy unless your health care provider has approved: °· Poor circulation. °· Healing wounds or scarred skin in the area being treated. °· Diabetes, heart disease, or high blood pressure. °· Not being able to feel (numbness) the area being treated. °· Unusual swelling of the area being treated. °· Active infections. °· Blood clots. °· Cancer. °· Inability to communicate pain. This may include young children and people who have problems with their brain function (dementia). °· Pregnancy. °Heat therapy should only be used on old, pre-existing, or long-lasting (chronic) injuries. Do not use heat therapy on new injuries unless directed by your health care provider. °HOW TO USE HEAT THERAPY °There are several different kinds of heat therapy, including: °· Moist heat pack. °· Warm water bath. °· Hot water bottle. °· Electric heating pad. °· Heated gel pack. °· Heated wrap. °· Electric heating pad. °Use the heat therapy method suggested by your health care provider. Follow your health care provider's instructions on when and how to use heat therapy. °GENERAL HEAT THERAPY RECOMMENDATIONS °· Do not sleep while using heat therapy. Only use heat therapy while you are awake. °· Your skin may turn pink while using heat therapy. Do not use heat therapy if your skin turns red. °· Do not use heat therapy if you have new pain. °· High heat or long exposure to heat can cause burns. Be careful when using heat therapy to avoid burning your skin. °· Do not use heat therapy on areas of your skin that are already irritated, such as with a rash or sunburn. °SEEK MEDICAL CARE IF: °· You have blisters, redness, swelling, or numbness. °· You have new pain. °· Your pain is worse. °MAKE SURE  YOU: °· Understand these instructions. °· Will watch your condition. °· Will get help right away if you are not doing well or get worse. °Document Released: 02/07/2012 Document Revised: 04/01/2014 Document Reviewed: 01/08/2014 °ExitCare® Patient Information ©2015 [employer]. This information is not intended to replace advice given to you by your health care provider. Make sure you discuss any questions you have with your health care provider. °Muscle Cramps and Spasms °Muscle cramps and spasms occur when a muscle or muscles tighten and you have no control over this tightening (involuntary muscle contraction). They are a common problem and can develop in any muscle. The most common place is in the calf muscles of the leg. Both muscle cramps and muscle spasms are involuntary muscle contractions, but they also have differences:  °· Muscle cramps are sporadic and painful. They may last a few seconds to a quarter of an hour. Muscle cramps are often more forceful and last longer than muscle spasms. °· Muscle spasms may or may not be painful. They may also last just a few seconds or much longer. °CAUSES  °It is uncommon for cramps or spasms to be due to a serious underlying problem. In many cases, the cause of cramps or spasms is unknown. Some common causes are:  °· Overexertion.   °· Overuse from repetitive motions (doing the same thing over and over).   °· Remaining in a certain position for a long period of time.   °· Improper preparation, form, or technique while performing a sport or activity.   °· Dehydration.   °· Injury.   °·   Side effects of some medicines.   °· Abnormally low levels of the salts and ions in your blood (electrolytes), especially potassium and calcium. This could happen if you are taking water pills (diuretics) or you are pregnant.   °Some underlying medical problems can make it more likely to develop cramps or spasms. These include, but are not limited to:  °· Diabetes.   °· Parkinson disease.    °· Hormone disorders, such as thyroid problems.   °· Alcohol abuse.   °· Diseases specific to muscles, joints, and bones.   °· Blood vessel disease where not enough blood is getting to the muscles.   °HOME CARE INSTRUCTIONS  °· Stay well hydrated. Drink enough water and fluids to keep your urine clear or pale yellow. °· It may be helpful to massage, stretch, and relax the affected muscle. °· For tight or tense muscles, use a warm towel, heating pad, or hot shower water directed to the affected area. °· If you are sore or have pain after a cramp or spasm, applying ice to the affected area may relieve discomfort. °· Put ice in a plastic bag. °· Place a towel between your skin and the bag. °· Leave the ice on for 15-20 minutes, 03-04 times a day. °· Medicines used to treat a known cause of cramps or spasms may help reduce their frequency or severity. Only take over-the-counter or prescription medicines as directed by your caregiver. °SEEK MEDICAL CARE IF:  °Your cramps or spasms get more severe, more frequent, or do not improve over time.  °MAKE SURE YOU:  °· Understand these instructions. °· Will watch your condition. °· Will get help right away if you are not doing well or get worse. °Document Released: 05/07/2002 Document Revised: 03/12/2013 Document Reviewed: 11/01/2012 °ExitCare® Patient Information ©2015 [employer]. This information is not intended to replace advice given to you by your health care provider. Make sure you discuss any questions you have with your health care provider. ° °

## 2014-10-09 NOTE — ED Notes (Signed)
Shari, PA at bedside. 

## 2014-10-31 ENCOUNTER — Emergency Department (HOSPITAL_COMMUNITY): Payer: Managed Care, Other (non HMO)

## 2014-10-31 ENCOUNTER — Encounter (HOSPITAL_COMMUNITY): Payer: Self-pay | Admitting: Emergency Medicine

## 2014-10-31 ENCOUNTER — Emergency Department (HOSPITAL_COMMUNITY)
Admission: EM | Admit: 2014-10-31 | Discharge: 2014-10-31 | Disposition: A | Discharge status: Home / Self Care | Payer: Managed Care, Other (non HMO) | Attending: Emergency Medicine | Admitting: Emergency Medicine

## 2014-10-31 DIAGNOSIS — Z79899 Other long term (current) drug therapy: Secondary | ICD-10-CM | POA: Insufficient documentation

## 2014-10-31 DIAGNOSIS — I10 Essential (primary) hypertension: Secondary | ICD-10-CM | POA: Insufficient documentation

## 2014-10-31 DIAGNOSIS — M25511 Pain in right shoulder: Secondary | ICD-10-CM | POA: Insufficient documentation

## 2014-10-31 DIAGNOSIS — Z720 Tobacco use: Secondary | ICD-10-CM | POA: Insufficient documentation

## 2014-10-31 DIAGNOSIS — Z791 Long term (current) use of non-steroidal anti-inflammatories (NSAID): Secondary | ICD-10-CM | POA: Insufficient documentation

## 2014-10-31 DIAGNOSIS — E119 Type 2 diabetes mellitus without complications: Secondary | ICD-10-CM | POA: Insufficient documentation

## 2014-10-31 DIAGNOSIS — M797 Fibromyalgia: Secondary | ICD-10-CM | POA: Insufficient documentation

## 2014-10-31 DIAGNOSIS — Z8639 Personal history of other endocrine, nutritional and metabolic disease: Secondary | ICD-10-CM | POA: Insufficient documentation

## 2014-10-31 LAB — CBG MONITORING, ED: Glucose-Capillary: 128 mg/dL — ABNORMAL HIGH (ref 70–99)

## 2014-10-31 MED ORDER — HYDROCODONE-ACETAMINOPHEN 5-325 MG PO TABS: 1.0000 | ORAL_TABLET | Freq: Four times a day (QID) | ORAL | Status: DC | PRN

## 2014-10-31 NOTE — ED Notes (Signed)
Pt states she has fibromyalgia and cannot lift right arm, pt states this normally doesn't last.

## 2014-10-31 NOTE — ED Notes (Addendum)
Patient denies loss of sensation and is able to move the extremity. She states that she can not raise the right arm above her head due to pain in her neck and shoulder. 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 Pain has been unrelieved with prescribed gabapentin and aleve.

## 2014-10-31 NOTE — Progress Notes (Signed)
  CARE MANAGEMENT ED NOTE 10/31/2014  Patient:  Eileen Bowen,Eileen Bowen   Account Number:  [id number]  Date Initiated:  10/31/2014  Documentation initiated by:  Edd ArbourGIBBS,Maryssa Giampietro  Subjective/Objective Assessment:   38 yr [place] c/o has fibromyalgia and cannot lift right arm, pt states this normally doesn't last.       Subjective/Objective Assessment Detail:   pt confirms no pcp  States she was dx with fibromyalgia over 5 yrs ago when she had insurance  agreed to [facility] referral  Never had orange card per pt  Pt is a CNA  Pt voiced appreciation of resources offered     Action/Plan:   Noted 3 ED visits (07/05/14, 10/09/14, & 10/31/14) no admissions in last 6 months see notes below   Action/Plan Detail:   Anticipated DC Date:  10/31/2014     Status Recommendation to Physician:   Result of Recommendation:    Other ED Services  Consult Working Plan    DC Planning Services  Other  Outpatient Services - Pt will follow up  PCP issues  [facility] / [facility] (established/new)    Choice offered to / List presented to:            Status of service:  Completed, signed off  ED Comments:   ED Comments Detail:  CM spoke with pt who [place]. CM discussed and provided written information for self pay pcps, importance of pcp for f/u care, www.needymeds.org, www.goodrx.com, discounted pharmacies and other [place] resources such as [facility], [facility], affordable care act,  financial assistance, DSS and  health department  Reviewed resources for [place] self pay pcps like [facility], family medicine at [address], [facility], general medical clinics, [facility] plus others, medication resources, [facility] out patient [facility]ies and housing Pt voiced understanding and appreciation of resources provided  Provided [facility] contact information Referral completed

## 2014-10-31 NOTE — Discharge Instructions (Signed)

## 2014-10-31 NOTE — ED Provider Notes (Signed)
CSN: [id number]     Arrival date & time 10/31/14  1219 History   First MD Initiated Contact with Patient 10/31/14 1313     Chief Complaint  Patient presents with  . Arm Problem    right    The history is provided by the patient.   [name] presents for right arm pain and difficulty with range of motion. She states that she awoke yesterday with inability to move her right arm. She has a history of fibromyalgia and has had multiple similar symptoms in the past. She has considerable pain with range of motion in the right shoulder and right lateral neck. On feeling well for the last 3 days and decreased appetite. She denies fevers, chest pain, cough, shortness of breath, abdominal pain, vomiting, numbness. 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 She states that she has a heavy workload and anytime she has a day off she feels poorly.  Past Medical History  Diagnosis Date  . Diabetes mellitus   . Hypertension   . Hypercholesteremia   . Fibromyalgia    Past Surgical History  Procedure Laterality Date  . Cesarean section     No family history on file. History  Substance Use Topics  . Smoking status: Current Every Day Smoker  . Smokeless tobacco: Not on file  . Alcohol Use: No   OB History    No data available     Review of Systems  All other systems reviewed and are negative.     Allergies  Review of patient's allergies indicates no known allergies.  Home Medications   Prior to Admission medications   Medication Sig Start Date End Date Taking? Authorizing Provider  DM-Phenylephrine-Acetaminophen (TYLENOL COLD MULTI-SYMPTOM) 10-5-325 MG/15ML LIQD Take 10 mLs by mouth every 8 (eight) hours as needed (for cold).   Yes Historical Provider, MD  gabapentin (NEURONTIN) 300 MG capsule Take 300 mg by mouth 3 (three) times daily.   Yes Historical Provider, MD  guaiFENesin (MUCINEX) 600 MG 12 hr tablet Take 1,200 mg by mouth every 6 (six) hours as needed for to loosen phlegm.   Yes  Historical Provider, MD  metFORMIN (GLUCOPHAGE) 500 MG tablet Take 500 mg by mouth 2 (two) times daily with a meal.   Yes Historical Provider, MD  naproxen sodium (ANAPROX) 220 MG tablet Take 440 mg by mouth 2 (two) times daily with a meal.    Yes Historical Provider, MD  cyclobenzaprine (FLEXERIL) 10 MG tablet Take 1 tablet (10 mg total) by mouth 2 (two) times daily as needed for muscle spasms. Patient not taking: Reported on 10/31/2014 10/09/14   Melvenia BeamShari A Upstill, PA-C  HYDROcodone-acetaminophen (NORCO/VICODIN) 5-325 MG per tablet Take 1 tablet by mouth every 4 (four) hours as needed for pain. Patient not taking: Reported on 10/31/2014 08/19/13   Melvenia BeamShari A Upstill, PA-C  hydrOXYzine (ATARAX/VISTARIL) 25 MG tablet Take 1 tablet (25 mg total) by mouth every 6 (six) hours. Patient not taking: Reported on 10/31/2014 07/05/14   Elwin MochaBlair Walden, MD  sulfamethoxazole-trimethoprim (BACTRIM DS) 800-160 MG per tablet Take 1 tablet by mouth 2 (two) times daily. Patient not taking: Reported on 10/31/2014 07/05/14   Elwin MochaBlair Walden, MD   BP 131/95 mmHg  Pulse 93  Temp(Src) 98 F (36.7 C) (Oral)  Resp 20  SpO2 97%  LMP 10/20/2014 Physical Exam  Constitutional: She is oriented to person, place, and time. She appears well-developed and well-nourished.  HENT:  Head: Normocephalic and atraumatic.  Cardiovascular: Normal  rate and regular rhythm.   No murmur heard. Pulmonary/Chest: Effort normal and breath sounds normal. No respiratory distress.  Abdominal: Soft. There is no tenderness. There is no rebound and no guarding.  Musculoskeletal:  Moderate tenderness over the right shoulder anteriorly and laterally, with tenderness over the right lateral neck as well. 2+ radial pulses bilaterally. I've out of 5 grip strength bilaterally. Sensation to light touch intact in bilateral upper extremities. Patient has pain with passive and active abduction and external rotation of the shoulder. There is no overlying erythema or  swelling of the shoulder.  Neurological: She is alert and oriented to person, place, and time.  Skin: Skin is warm and dry.  Psychiatric: She has a normal mood and affect. Her behavior is normal.  Nursing note and vitals reviewed.   ED Course  Procedures (including critical care time) Labs Review Labs Reviewed  CBG MONITORING, ED - Abnormal; Notable for the following:    Glucose-Capillary 128 (*)    All other components within normal limits    Imaging Review Dg Shoulder Right  10/31/2014   CLINICAL DATA:  Right shoulder pain and swelling for 2 days  EXAM: RIGHT SHOULDER - 2+ VIEW  COMPARISON:  None.  FINDINGS: There is no evidence of fracture or dislocation. There is no evidence of arthropathy or other focal bone abnormality. Soft tissues are unremarkable.  IMPRESSION: No acute osseous injury of the right shoulder.   Electronically Signed   By: Elige KoHetal  Patel   On: 10/31/2014 15:15     EKG Interpretation None      MDM   Final diagnoses:  Right shoulder pain   Patient here with right shoulder pain, this is muscular skeletal in nature based on history and examination. Blood sugar check because patient has not been checking her sugars at home. Clinical picture is not consistent with cholecystitis, pneumonia, ACS. Discussed with patient's home care for musculoskeletal shoulder pain with range of motion exercises and pain control. Discussed with patient importance of outpatient follow-up for recheck  Tilden FossaElizabeth Tavonte Seybold, MD 10/31/14 1708

## 2015-04-09 ENCOUNTER — Emergency Department (HOSPITAL_COMMUNITY)
Admission: EM | Admit: 2015-04-09 | Discharge: 2015-04-09 | Disposition: A | Discharge status: Home / Self Care | Payer: BLUE CROSS/BLUE SHIELD | Attending: Emergency Medicine | Admitting: Emergency Medicine

## 2015-04-09 ENCOUNTER — Encounter (HOSPITAL_COMMUNITY): Payer: Self-pay | Admitting: Emergency Medicine

## 2015-04-09 DIAGNOSIS — Z8739 Personal history of other diseases of the musculoskeletal system and connective tissue: Secondary | ICD-10-CM | POA: Diagnosis not present

## 2015-04-09 DIAGNOSIS — Z8639 Personal history of other endocrine, nutritional and metabolic disease: Secondary | ICD-10-CM | POA: Insufficient documentation

## 2015-04-09 DIAGNOSIS — R51 Headache: Secondary | ICD-10-CM | POA: Insufficient documentation

## 2015-04-09 DIAGNOSIS — R519 Headache, unspecified: Secondary | ICD-10-CM

## 2015-04-09 DIAGNOSIS — E119 Type 2 diabetes mellitus without complications: Secondary | ICD-10-CM | POA: Insufficient documentation

## 2015-04-09 DIAGNOSIS — I10 Essential (primary) hypertension: Secondary | ICD-10-CM | POA: Diagnosis not present

## 2015-04-09 DIAGNOSIS — Z720 Tobacco use: Secondary | ICD-10-CM | POA: Diagnosis not present

## 2015-04-09 HISTORY — DX: Migraine, unspecified, not intractable, without status migrainosus: G43.909

## 2015-04-09 MED ORDER — METOCLOPRAMIDE HCL 5 MG/ML IJ SOLN
10.0000 mg | Freq: Once | INTRAMUSCULAR | Status: DC
  Filled 2015-04-09: qty 2

## 2015-04-09 MED ORDER — KETOROLAC TROMETHAMINE 30 MG/ML IJ SOLN
30.0000 mg | Freq: Once | INTRAMUSCULAR | Status: DC
  Filled 2015-04-09: qty 1

## 2015-04-09 MED ORDER — DIPHENHYDRAMINE HCL 50 MG/ML IJ SOLN
INTRAMUSCULAR | Status: AC
  Filled 2015-04-09: qty 1

## 2015-04-09 MED ORDER — KETOROLAC TROMETHAMINE 30 MG/ML IJ SOLN
INTRAMUSCULAR | Status: AC
  Filled 2015-04-09: qty 1

## 2015-04-09 MED ORDER — SODIUM CHLORIDE 0.9 % IV BOLUS (SEPSIS): 1000.0000 mL | Freq: Once | INTRAVENOUS | Status: DC

## 2015-04-09 MED ORDER — ONDANSETRON 8 MG PO TBDP
8.0000 mg | ORAL_TABLET | Freq: Once | ORAL | Status: AC
  Administered 2015-04-09: 8 mg via ORAL
  Filled 2015-04-09: qty 1

## 2015-04-09 MED ORDER — KETOROLAC TROMETHAMINE 30 MG/ML IJ SOLN
30.0000 mg | Freq: Once | INTRAMUSCULAR | Status: AC
  Administered 2015-04-09: 30 mg via INTRAMUSCULAR

## 2015-04-09 MED ORDER — DIPHENHYDRAMINE HCL 50 MG/ML IJ SOLN
25.0000 mg | Freq: Once | INTRAMUSCULAR | Status: DC
  Filled 2015-04-09: qty 1

## 2015-04-09 MED ORDER — DIPHENHYDRAMINE HCL 25 MG PO CAPS
25.0000 mg | ORAL_CAPSULE | Freq: Once | ORAL | Status: AC
  Administered 2015-04-09: 25 mg via ORAL
  Filled 2015-04-09: qty 1

## 2015-04-09 NOTE — Discharge Instructions (Signed)

## 2015-04-09 NOTE — ED Provider Notes (Signed)
CSN: [id number]     Arrival date & time 04/09/15  1118 History   First MD Initiated Contact with Patient 04/09/15 1148     Chief Complaint  Patient presents with  . Migraine     (Consider location/radiation/quality/duration/timing/severity/associated sxs/prior Treatment) HPI Comments: Patient presents to the emergency department with chief complaint of headache. She has a history of migraines. States that she gets them occasionally. States that this episode started yesterday. The headache has progressively worsened. She denies any fevers chills. Denies any neck stiffness. Denies any nausea or vomiting. She states that the symptoms are worsened with light and noise. She has tried taking Aleve and Tylenol with no relief.  The history is provided by the patient. No language interpreter was used.    Past Medical History  Diagnosis Date  . Diabetes mellitus   . Hypertension   . Hypercholesteremia   . Fibromyalgia   . Migraine    Past Surgical History  Procedure Laterality Date  . Cesarean section     No family history on file. History  Substance Use Topics  . Smoking status: Current Every Day Smoker  . Smokeless tobacco: Not on file  . Alcohol Use: No   OB History    No data available     Review of Systems  Constitutional: Negative for fever and chills.  Respiratory: Negative for shortness of breath.   Cardiovascular: Negative for chest pain.  Gastrointestinal: Negative for nausea, vomiting, diarrhea and constipation.  Genitourinary: Negative for dysuria.  Neurological: Positive for headaches.      Allergies  Review of patient's allergies indicates no known allergies.  Home Medications   Prior to Admission medications   Medication Sig Start Date End Date Taking? Authorizing Provider  acetaminophen (TYLENOL) 500 MG tablet Take 1,000 mg by mouth every 4 (four) hours as needed for moderate pain or headache.   Yes Historical Provider, MD  ibuprofen (ADVIL,MOTRIN) 200  MG tablet Take 800 mg by mouth every 6 (six) hours as needed for headache or moderate pain.   Yes Historical Provider, MD  HYDROcodone-acetaminophen (NORCO/VICODIN) 5-325 MG per tablet Take 1 tablet by mouth every 6 (six) hours as needed for moderate pain or severe pain. Patient not taking: Reported on 04/09/2015 10/31/14   Tilden FossaElizabeth Rees, MD  hydrOXYzine (ATARAX/VISTARIL) 25 MG tablet Take 1 tablet (25 mg total) by mouth every 6 (six) hours. Patient not taking: Reported on 10/31/2014 07/05/14   Elwin MochaBlair Walden, MD   BP 129/86 mmHg  Pulse 76  Temp(Src) 98.2 F (36.8 C) (Oral)  Resp 16  SpO2 96%  LMP 04/06/2015 Physical Exam  Constitutional: She is oriented to person, place, and time. She appears well-developed and well-nourished.  HENT:  Head: Normocephalic and atraumatic.  Right Ear: External ear normal.  Left Ear: External ear normal.  Eyes: Conjunctivae and EOM are normal. Pupils are equal, round, and reactive to light.  Neck: Normal range of motion. Neck supple.  No pain with neck flexion, no meningismus  Cardiovascular: Normal rate, regular rhythm and normal heart sounds.  Exam reveals no gallop and no friction rub.   No murmur heard. Pulmonary/Chest: Effort normal and breath sounds normal. No respiratory distress. She has no wheezes. She has no rales. She exhibits no tenderness.  Abdominal: Soft. She exhibits no distension and no mass. There is no tenderness. There is no rebound and no guarding.  Musculoskeletal: Normal range of motion. She exhibits no edema or tenderness.  Normal gait.  Neurological: She is alert and  oriented to person, place, and time. She has normal reflexes.  CN 3-12 intact, normal finger to nose, no pronator drift, sensation and strength intact bilaterally.  Skin: Skin is warm and dry.  Psychiatric: She has a normal mood and affect. Her behavior is normal. Judgment and thought content normal.  Nursing note and vitals reviewed.   ED Course  Procedures (including  critical care time) Labs Review Labs Reviewed - No data to display  Imaging Review No results found.   EKG Interpretation None      MDM   Final diagnoses:  Headache, unspecified headache type   IV access was unable to be obtained. Will give IM Toradol, by mouth Benadryl and Zofran. Recommend aggressive hydration. Patient states that she needs to be discharged so that she can pick up her children from school.  Pt HA treated and improved while in ED.  Presentation is like pts typical HA and non concerning for [facility], ICH, Meningitis, or temporal arteritis. Pt is afebrile with no focal neuro deficits, nuchal rigidity, or change in vision. Pt is to follow up with PCP to discuss prophylactic medication. Pt verbalizes understanding and is agreeable with plan to dc.      Roxy Horsemanobert Laquitha Heslin, PA-C 04/09/15 1328  Purvis SheffieldForrest Harrison, MD 04/10/15 [phone]

## 2015-04-09 NOTE — ED Notes (Signed)
Per pt, states headache since yesterday

## 2015-04-09 NOTE — ED Notes (Signed)
My colleague attempted IV and was unsuccessful.  Our P.A., Rob speaks with her per her request to not have IV if possible.

## 2015-05-17 ENCOUNTER — Encounter (HOSPITAL_COMMUNITY): Payer: Self-pay

## 2015-05-17 ENCOUNTER — Emergency Department (HOSPITAL_COMMUNITY): Payer: BLUE CROSS/BLUE SHIELD

## 2015-05-17 ENCOUNTER — Emergency Department (HOSPITAL_COMMUNITY)
Admission: EM | Admit: 2015-05-17 | Discharge: 2015-05-17 | Disposition: A | Discharge status: Home / Self Care | Payer: BLUE CROSS/BLUE SHIELD | Attending: Emergency Medicine | Admitting: Emergency Medicine

## 2015-05-17 DIAGNOSIS — Z79899 Other long term (current) drug therapy: Secondary | ICD-10-CM | POA: Insufficient documentation

## 2015-05-17 DIAGNOSIS — I10 Essential (primary) hypertension: Secondary | ICD-10-CM | POA: Diagnosis not present

## 2015-05-17 DIAGNOSIS — Z720 Tobacco use: Secondary | ICD-10-CM | POA: Insufficient documentation

## 2015-05-17 DIAGNOSIS — E119 Type 2 diabetes mellitus without complications: Secondary | ICD-10-CM | POA: Diagnosis not present

## 2015-05-17 DIAGNOSIS — Z791 Long term (current) use of non-steroidal anti-inflammatories (NSAID): Secondary | ICD-10-CM | POA: Insufficient documentation

## 2015-05-17 DIAGNOSIS — M79672 Pain in left foot: Secondary | ICD-10-CM | POA: Diagnosis present

## 2015-05-17 MED ORDER — NAPROXEN 500 MG PO TABS: 500.0000 mg | ORAL_TABLET | Freq: Two times a day (BID) | ORAL | Status: DC

## 2015-05-17 NOTE — ED Provider Notes (Signed)
CSN: [id number]     Arrival date & time 05/17/15  4098 History   First MD Initiated Contact with Patient 05/17/15 [phone]     Chief Complaint  Patient presents with  . Ankle Pain     (Consider location/radiation/quality/duration/timing/severity/associated sxs/prior Treatment) Patient is a 39 y.o. female presenting with ankle pain. The history is provided by the patient.  Ankle Pain Location:  Foot Injury: no   Foot location:  L foot Pain details:    Quality:  Cramping   Severity:  Mild   Onset quality:  Gradual   Progression:  Worsening Chronicity:  New Relieved by:  Nothing Worsened by:  Flexion Associated symptoms: no fever      Ms. Glade is a 39 yo F PMH fibromyalgia and DM2 untreated that is p/w left foot pain and swelling. Her symptoms started about two weeks ago. Initial pain on her left dorsal foot that is over her fifth metatarsal.  She denies any trauma or injury and is crampy in nature.  Pain is worse with plantar flexion. She has used biofreeze, bengay, and aleve with no improvement. She works as a Lawyer. Denies any fever, chills, night sweats, chest pain, SOB, nausea, vomiting, constipation or diarrhea.   Past Medical History  Diagnosis Date  . Diabetes mellitus   . Hypertension   . Hypercholesteremia   . Fibromyalgia   . Migraine    Past Surgical History  Procedure Laterality Date  . Cesarean section     History reviewed. No pertinent family history. History  Substance Use Topics  . Smoking status: Current Every Day Smoker  . Smokeless tobacco: Not on file  . Alcohol Use: No   OB History    No data available     Review of Systems  Constitutional: Negative for fever and chills.  Respiratory: Negative for shortness of breath.   Cardiovascular: Negative for chest pain.  Gastrointestinal: Negative for nausea, vomiting, abdominal pain, diarrhea and constipation.  Genitourinary: Negative for dysuria.  Musculoskeletal: Positive for gait problem.  Skin:  Negative for rash.  Neurological: Negative for weakness and numbness.      Allergies  Review of patient's allergies indicates no known allergies.  Home Medications   Prior to Admission medications   Medication Sig Start Date End Date Taking? Authorizing Provider  naproxen sodium (ANAPROX) 220 MG tablet Take 440 mg by mouth 2 (two) times daily as needed (pain).   Yes Historical Provider, MD  HYDROcodone-acetaminophen (NORCO/VICODIN) 5-325 MG per tablet Take 1 tablet by mouth every 6 (six) hours as needed for moderate pain or severe pain. Patient not taking: Reported on 04/09/2015 10/31/14   Tilden Fossa, MD  hydrOXYzine (ATARAX/VISTARIL) 25 MG tablet Take 1 tablet (25 mg total) by mouth every 6 (six) hours. Patient not taking: Reported on 10/31/2014 07/05/14   Elwin Mocha, MD  naproxen (NAPROSYN) 500 MG tablet Take 1 tablet (500 mg total) by mouth 2 (two) times daily with a meal. 05/17/15   Myra Rude, MD   BP 119/96 mmHg  Pulse 83  Temp(Src) 98.2 F (36.8 C) (Oral)  Resp 16  SpO2 96%  LMP 05/06/2015 (Exact Date) Physical Exam  Constitutional: She is oriented to person, place, and time. She appears well-developed and well-nourished.  HENT:  Head: Normocephalic and atraumatic.  Eyes: Conjunctivae and EOM are normal.  Neck: Normal range of motion.  Cardiovascular: Normal rate, regular rhythm, normal heart sounds and intact distal pulses.   No murmur heard. Pulmonary/Chest: Effort normal and breath  sounds normal. No respiratory distress. She has no wheezes.  Abdominal: Soft. Bowel sounds are normal. There is no tenderness.  Musculoskeletal:       Feet:  Left Foot: no erythema or ecchymosis  Swollen compared to the right  +2 pulses  Normal ROM but dorsi and plantar flexion exacerbates her pain  Normal sensation  Neg talar tilt  Neg anterior drawer  Neg squeeze test  No laxity  General pain over dorsal aspect of her foot originating over fifth metatarsal   No pain over  distal fibula and tibia. No navicular tenderness   Neurological: She is alert and oriented to person, place, and time.  Skin: Skin is warm. No rash noted.    ED Course  Procedures (including critical care time) Labs Review Labs Reviewed - No data to display  Imaging Review Dg Foot Complete Left  05/17/2015   CLINICAL DATA:  Swelling in the proximal left foot.  Denies injury.  EXAM: LEFT FOOT - COMPLETE 3+ VIEW  COMPARISON:  None.  FINDINGS: Negative for fracture or dislocation. Prominent spur along the plantar aspect of the calcaneus. Soft tissues are unremarkable.  IMPRESSION: No acute bone abnormality.  Calcaneal spur.   Electronically Signed   By: Richarda Overlie M.D.   On: 05/17/2015 10:55     EKG Interpretation None       MDM   Final diagnoses:  Left foot pain    Ms. Nutt is a 39 yo F that p/w with Left foot pain and swelling. Will obtain imaging to rule out fracture as she was tenderness over fifth metatarsal. Imaging not showing any fractures.  Possible for gout vs ligamentous injury vs foot sprain. Will make non weight bearing, given naproxen 500 mg BID and apply ACE wrap. Given information about orthopaedic for f/u if persists. Patient agreeable with plan and discharge.      Myra Rude, MD PGY-2, [facility] 05/17/2015, 11:13 AM      Myra Rude, MD 05/17/15 1113  Nelva Nay, MD 05/17/15 1200

## 2015-05-17 NOTE — ED Notes (Signed)
She c/o non-traumatic left ankle pain x 2 weeks.  She is in no distress and is able to ambulate.

## 2015-05-17 NOTE — Discharge Instructions (Signed)
Diabetes and Foot Care Diabetes may cause you to have problems because of poor blood supply (circulation) to your feet and legs. This may cause the skin on your feet to become thinner, break easier, and heal more slowly. Your skin may become dry, and the skin may peel and crack. You may also have nerve damage in your legs and feet causing decreased feeling in them. You may not notice minor injuries to your feet that could lead to infections or more serious problems. Taking care of your feet is one of the most important things you can do for yourself.  HOME CARE INSTRUCTIONS  Wear shoes at all times, even in the house. Do not go barefoot. Bare feet are easily injured.  Check your feet daily for blisters, cuts, and redness. If you cannot see the bottom of your feet, use a mirror or ask someone for help.  Wash your feet with warm water (do not use hot water) and mild soap. Then pat your feet and the areas between your toes until they are completely dry. Do not soak your feet as this can dry your skin.  Apply a moisturizing lotion or petroleum jelly (that does not contain alcohol and is unscented) to the skin on your feet and to dry, brittle toenails. Do not apply lotion between your toes.  Trim your toenails straight across. Do not dig under them or around the cuticle. File the edges of your nails with an emery board or nail file.  Do not cut corns or calluses or try to remove them with medicine.  Wear clean socks or stockings every day. Make sure they are not too tight. Do not wear knee-high stockings since they may decrease blood flow to your legs.  Wear shoes that fit properly and have enough cushioning. To break in new shoes, wear them for just a few hours a day. This prevents you from injuring your feet. Always look in your shoes before you put them on to be sure there are no objects inside.  Do not cross your legs. This may decrease the blood flow to your feet.  If you find a minor scrape,  cut, or break in the skin on your feet, keep it and the skin around it clean and dry. These areas may be cleansed with mild soap and water. Do not cleanse the area with peroxide, alcohol, or iodine.  When you remove an adhesive bandage, be sure not to damage the skin around it.  If you have a wound, look at it several times a day to make sure it is healing.  Do not use heating pads or hot water bottles. They may burn your skin. If you have lost feeling in your feet or legs, you may not know it is happening until it is too late.  Make sure your health care provider performs a complete foot exam at least annually or more often if you have foot problems. Report any cuts, sores, or bruises to your health care provider immediately. SEEK MEDICAL CARE IF:   You have an injury that is not healing.  You have cuts or breaks in the skin.  You have an ingrown nail.  You notice redness on your legs or feet.  You feel burning or tingling in your legs or feet.  You have pain or cramps in your legs and feet.  Your legs or feet are numb.  Your feet always feel cold. SEEK IMMEDIATE MEDICAL CARE IF:   There is increasing redness,   swelling, or pain in or around a wound.  There is a red line that goes up your leg.  Pus is coming from a wound.  You develop a fever or as directed by your health care provider.  You notice a bad smell coming from an ulcer or wound. Document Released: 11/12/2000 Document Revised: 07/18/2013 Document Reviewed: 04/24/2013 [employer] Patient Information 2015 [employer]. This information is not intended to replace advice given to you by your health care provider. Make sure you discuss any questions you have with your health care provider.  

## 2015-05-17 NOTE — ED Notes (Signed)
MD at bedside. 

## 2015-06-17 ENCOUNTER — Encounter (HOSPITAL_COMMUNITY): Payer: Self-pay | Admitting: Emergency Medicine

## 2015-06-17 ENCOUNTER — Emergency Department (HOSPITAL_COMMUNITY)
Admission: EM | Admit: 2015-06-17 | Discharge: 2015-06-17 | Disposition: A | Discharge status: Home / Self Care | Payer: BLUE CROSS/BLUE SHIELD | Attending: Emergency Medicine | Admitting: Emergency Medicine

## 2015-06-17 DIAGNOSIS — I10 Essential (primary) hypertension: Secondary | ICD-10-CM | POA: Insufficient documentation

## 2015-06-17 DIAGNOSIS — H748X3 Other specified disorders of middle ear and mastoid, bilateral: Secondary | ICD-10-CM | POA: Diagnosis not present

## 2015-06-17 DIAGNOSIS — Z8679 Personal history of other diseases of the circulatory system: Secondary | ICD-10-CM | POA: Insufficient documentation

## 2015-06-17 DIAGNOSIS — Z720 Tobacco use: Secondary | ICD-10-CM | POA: Diagnosis not present

## 2015-06-17 DIAGNOSIS — J309 Allergic rhinitis, unspecified: Secondary | ICD-10-CM | POA: Diagnosis not present

## 2015-06-17 DIAGNOSIS — R51 Headache: Secondary | ICD-10-CM | POA: Diagnosis present

## 2015-06-17 DIAGNOSIS — H578 Other specified disorders of eye and adnexa: Secondary | ICD-10-CM | POA: Insufficient documentation

## 2015-06-17 DIAGNOSIS — E119 Type 2 diabetes mellitus without complications: Secondary | ICD-10-CM | POA: Diagnosis not present

## 2015-06-17 DIAGNOSIS — Z8739 Personal history of other diseases of the musculoskeletal system and connective tissue: Secondary | ICD-10-CM | POA: Insufficient documentation

## 2015-06-17 DIAGNOSIS — J069 Acute upper respiratory infection, unspecified: Secondary | ICD-10-CM | POA: Insufficient documentation

## 2015-06-17 LAB — CBG MONITORING, ED: GLUCOSE-CAPILLARY: 142 mg/dL — AB (ref 65–99)

## 2015-06-17 MED ORDER — CETIRIZINE HCL 10 MG PO TABS: 10.0000 mg | ORAL_TABLET | Freq: Every day | ORAL | Status: DC

## 2015-06-17 MED ORDER — FLUTICASONE PROPIONATE 50 MCG/ACT NA SUSP: 2.0000 | Freq: Every day | NASAL | Status: DC

## 2015-06-17 MED ORDER — BENZONATATE 100 MG PO CAPS: 100.0000 mg | ORAL_CAPSULE | Freq: Three times a day (TID) | ORAL | Status: DC

## 2015-06-17 MED ORDER — NAPROXEN 250 MG PO TABS: 250.0000 mg | ORAL_TABLET | Freq: Two times a day (BID) | ORAL | Status: DC

## 2015-06-17 NOTE — ED Provider Notes (Signed)
CSN: [id number]     Arrival date & time 06/17/15  1033 History   First MD Initiated Contact with Patient 06/17/15 1038     Chief Complaint  Patient presents with  . Facial Pain    sinus pressure  . Otalgia    l/ear pain  . Sore Throat  . Cough    x 3 days  . Eye Drainage   Eileen Bowen is a 39 y.o. female with a history of hypertension and diabetes who presents to the ED complaining of nasal congestion, runny nose, post nasal drip, ear pressure, sore throat and intermittent cough for the past 3 days. Patient reports lots of post nasal drip which then makes her cough. She also reports eye irritation and discharge. She complains of 4/10 throat pain. She denies trouble swallowing. The patient denies sick contacts. She has taken nothing for treatment today. The patient denies fevers, chills, shortness of breath, wheezing, chest pain, trouble swallowing, abdominal pain, rashes, nausea or vomiting.  (Consider location/radiation/quality/duration/timing/severity/associated sxs/prior Treatment) HPI  Past Medical History  Diagnosis Date  . Diabetes mellitus   . Hypertension   . Hypercholesteremia   . Fibromyalgia   . Migraine    Past Surgical History  Procedure Laterality Date  . Cesarean section     No family history on file. History  Substance Use Topics  . Smoking status: Current Every Day Smoker  . Smokeless tobacco: Not on file  . Alcohol Use: No   OB History    No data available     Review of Systems  Constitutional: Negative for fever and chills.  HENT: Positive for congestion, ear pain, postnasal drip, rhinorrhea, sinus pressure, sneezing and sore throat. Negative for dental problem, drooling, ear discharge, facial swelling, hearing loss, mouth sores, nosebleeds, trouble swallowing and voice change.   Eyes: Positive for discharge. Negative for photophobia and visual disturbance.  Respiratory: Positive for cough. Negative for chest tightness, shortness of breath and  wheezing.   Cardiovascular: Negative for chest pain and palpitations.  Gastrointestinal: Negative for nausea, vomiting and abdominal pain.  Musculoskeletal: Negative for myalgias and neck pain.  Skin: Negative for rash.  Neurological: Negative for light-headedness and headaches.      Allergies  Review of patient's allergies indicates no known allergies.  Home Medications   Prior to Admission medications   Medication Sig Start Date End Date Taking? Authorizing Provider  benzonatate (TESSALON) 100 MG capsule Take 1 capsule (100 mg total) by mouth every 8 (eight) hours. 06/17/15   Everlene Farrier, PA-C  cetirizine (ZYRTEC ALLERGY) 10 MG tablet Take 1 tablet (10 mg total) by mouth daily. 06/17/15   Everlene Farrier, PA-C  fluticasone (FLONASE) 50 MCG/ACT nasal spray Place 2 sprays into both nostrils daily. 06/17/15   Everlene Farrier, PA-C  HYDROcodone-acetaminophen (NORCO/VICODIN) 5-325 MG per tablet Take 1 tablet by mouth every 6 (six) hours as needed for moderate pain or severe pain. Patient not taking: Reported on 04/09/2015 10/31/14   Tilden Fossa, MD  hydrOXYzine (ATARAX/VISTARIL) 25 MG tablet Take 1 tablet (25 mg total) by mouth every 6 (six) hours. Patient not taking: Reported on 10/31/2014 07/05/14   Elwin Mocha, MD  naproxen (NAPROSYN) 250 MG tablet Take 1 tablet (250 mg total) by mouth 2 (two) times daily with a meal. 06/17/15   Everlene Farrier, PA-C  naproxen sodium (ANAPROX) 220 MG tablet Take 440 mg by mouth 2 (two) times daily as needed (pain).    Historical Provider, MD   BP 138/93 mmHg  Pulse 90  Temp(Src) 98.6 F (37 C) (Oral)  Resp 18  SpO2 100% Physical Exam  Constitutional: She appears well-developed and well-nourished. No distress.  nontoxic appearing.  HENT:  Head: Normocephalic and atraumatic.  Right Ear: External ear normal. No drainage or swelling. No mastoid tenderness. Tympanic membrane is not injected, not erythematous, not retracted and not bulging. A middle ear  effusion is present.  Left Ear: External ear normal. No drainage or swelling. No mastoid tenderness. Tympanic membrane is not injected, not erythematous, not retracted and not bulging. A middle ear effusion is present.  Nose: Rhinorrhea present. Right sinus exhibits no maxillary sinus tenderness and no frontal sinus tenderness. Left sinus exhibits no maxillary sinus tenderness and no frontal sinus tenderness.  Mouth/Throat: Posterior oropharyngeal erythema present. No oropharyngeal exudate, posterior oropharyngeal edema or tonsillar abscesses.  No tonsillar hypertrophy or exaggerates. Uvula is midline without edema. Tongue protrusion is normal. There is mild posterior oropharyngeal erythema without edema.  There is a mild amount of inner ear effusion bilaterally. Bilateral tympanic membranes are pearly-gray without erythema or loss of landmarks.   Eyes: Conjunctivae and EOM are normal. Pupils are equal, round, and reactive to light. Right eye exhibits no discharge. Left eye exhibits no discharge. No scleral icterus.  No conjunctival injection or discharge bilaterally.  Neck: Normal range of motion. Neck supple.  Cardiovascular: Normal rate, regular rhythm, normal heart sounds and intact distal pulses.   Pulmonary/Chest: Effort normal and breath sounds normal. No respiratory distress. She has no wheezes. She has no rales.  Lungs are clear to auscultation bilaterally. No wheezes or rhonchi.  Abdominal: Soft. There is no tenderness.  Lymphadenopathy:    She has no cervical adenopathy.  Neurological: She is alert. Coordination normal.  Skin: Skin is warm and dry. No rash noted. She is not diaphoretic. No erythema. No pallor.  Psychiatric: She has a normal mood and affect. Her behavior is normal.  Nursing note and vitals reviewed.   ED Course  Procedures (including critical care time) Labs Review Labs Reviewed  CBG MONITORING, ED - Abnormal; Notable for the following:    Glucose-Capillary 142  (*)    All other components within normal limits    Imaging Review No results found.   EKG Interpretation None      Filed Vitals:   06/17/15 1048  BP: 138/93  Pulse: 90  Temp: 98.6 F (37 C)  TempSrc: Oral  Resp: 18  SpO2: 100%     MDM   Meds given in ED:  Medications - No data to display  New Prescriptions   BENZONATATE (TESSALON) 100 MG CAPSULE    Take 1 capsule (100 mg total) by mouth every 8 (eight) hours.   CETIRIZINE (ZYRTEC ALLERGY) 10 MG TABLET    Take 1 tablet (10 mg total) by mouth daily.   FLUTICASONE (FLONASE) 50 MCG/ACT NASAL SPRAY    Place 2 sprays into both nostrils daily.   NAPROXEN (NAPROSYN) 250 MG TABLET    Take 1 tablet (250 mg total) by mouth 2 (two) times daily with a meal.    Final diagnoses:  Upper respiratory infection  Allergic rhinitis, unspecified allergic rhinitis type   This is a 39 y.o. female with a history of hypertension and diabetes who presents to the ED complaining of nasal congestion, runny nose, post nasal drip, ear pressure, sore throat and intermittent cough for the past 3 days. Patient reports lots of post nasal drip which then makes her cough. She also reports eye  irritation and discharge. She complains of 4/10 throat pain. She denies trouble swallowing. Patient reports that her diabetes is diet controlled currently. On exam the patient is afebrile and nontoxic appearing. Patient's lungs are clear to auscultation bilaterally. The patient does have a mild amount of inner ear effusion without TM erythema or loss of landmarks. No sinus tenderness. Patient has mild posterior oropharyngeal erythema without tonsillar hypertrophy or exudates. Here is no conjunctival injection bilaterally. No eye discharge noted. Patient has symptoms consistent with a viral upper respiratory infection. Patient's blood sugar is 142 in the emergency department. We'll discharge the patient prescriptions for naproxen, Flonase nasal spray, Zyrtec and Tessalon Perles  for her cough. Advised patient to follow-up with her primary care provider to have her blood pressure and blood sugar rechecked. Strict return precautions provided. I advised the patient to follow-up with their primary care provider this week. I advised the patient to return to the emergency department with new or worsening symptoms or new concerns. The patient verbalized understanding and agreement with plan.      Everlene FarrierWilliam Marilu Rylander, PA-C 06/17/15 1123  Lorre NickAnthony Allen, MD 06/17/15 [id number]

## 2015-06-17 NOTE — ED Notes (Signed)
Pt reports upper respiratory cold symptoms x 3 days. Pt started with clear sinus drainage, slight headache, sore throat and intermittent cough. C/o r/eye redness and drainage

## 2015-06-17 NOTE — Discharge Instructions (Signed)
Upper Respiratory Infection, Adult An upper respiratory infection (URI) is also sometimes known as the common cold. The upper respiratory tract includes the nose, sinuses, throat, trachea, and bronchi. Bronchi are the airways leading to the lungs. Most people improve within 1 week, but symptoms can last up to 2 weeks. A residual cough may last even longer.  CAUSES Many different viruses can infect the tissues lining the upper respiratory tract. The tissues become irritated and inflamed and often become very moist. Mucus production is also common. A cold is contagious. You can easily spread the virus to others by oral contact. This includes kissing, sharing a glass, coughing, or sneezing. Touching your mouth or nose and then touching a surface, which is then touched by another person, can also spread the virus. SYMPTOMS  Symptoms typically develop 1 to 3 days after you come in contact with a cold virus. Symptoms vary from person to person. They may include:  Runny nose.  Sneezing.  Nasal congestion.  Sinus irritation.  Sore throat.  Loss of voice (laryngitis).  Cough.  Fatigue.  Muscle aches.  Loss of appetite.  Headache.  Low-grade fever. DIAGNOSIS  You might diagnose your own cold based on familiar symptoms, since most people get a cold 2 to 3 times a year. Your caregiver can confirm this based on your exam. Most importantly, your caregiver can check that your symptoms are not due to another disease such as strep throat, sinusitis, pneumonia, asthma, or epiglottitis. Blood tests, throat tests, and X-rays are not necessary to diagnose a common cold, but they may sometimes be helpful in excluding other more serious diseases. Your caregiver will decide if any further tests are required. RISKS AND COMPLICATIONS  You may be at risk for a more severe case of the common cold if you smoke cigarettes, have chronic heart disease (such as heart failure) or lung disease (such as asthma), or if  you have a weakened immune system. The very young and very old are also at risk for more serious infections. Bacterial sinusitis, middle ear infections, and bacterial pneumonia can complicate the common cold. The common cold can worsen asthma and chronic obstructive pulmonary disease (COPD). Sometimes, these complications can require emergency medical care and may be life-threatening. PREVENTION  The best way to protect against getting a cold is to practice good hygiene. Avoid oral or hand contact with people with cold symptoms. Wash your hands often if contact occurs. There is no clear evidence that vitamin C, vitamin E, echinacea, or exercise reduces the chance of developing a cold. However, it is always recommended to get plenty of rest and practice good nutrition. TREATMENT  Treatment is directed at relieving symptoms. There is no cure. Antibiotics are not effective, because the infection is caused by a virus, not by bacteria. Treatment may include:  Increased fluid intake. Sports drinks offer valuable electrolytes, sugars, and fluids.  Breathing heated mist or steam (vaporizer or shower).  Eating chicken soup or other clear broths, and maintaining good nutrition.  Getting plenty of rest.  Using gargles or lozenges for comfort.  Controlling fevers with ibuprofen or acetaminophen as directed by your caregiver.  Increasing usage of your inhaler if you have asthma. Zinc gel and zinc lozenges, taken in the first 24 hours of the common cold, can shorten the duration and lessen the severity of symptoms. Pain medicines may help with fever, muscle aches, and throat pain. A variety of non-prescription medicines are available to treat congestion and runny nose. Your caregiver  can make recommendations and may suggest nasal or lung inhalers for other symptoms.  HOME CARE INSTRUCTIONS   Only take over-the-counter or prescription medicines for pain, discomfort, or fever as directed by your  caregiver.  Use a warm mist humidifier or inhale steam from a shower to increase air moisture. This may keep secretions moist and make it easier to breathe.  Drink enough water and fluids to keep your urine clear or pale yellow.  Rest as needed.  Return to work when your temperature has returned to normal or as your caregiver advises. You may need to stay home longer to avoid infecting others. You can also use a face mask and careful hand washing to prevent spread of the virus. SEEK MEDICAL CARE IF:   After the first few days, you feel you are getting worse rather than better.  You need your caregiver's advice about medicines to control symptoms.  You develop chills, worsening shortness of breath, or brown or red sputum. These may be signs of pneumonia.  You develop yellow or brown nasal discharge or pain in the face, especially when you bend forward. These may be signs of sinusitis.  You develop a fever, swollen neck glands, pain with swallowing, or white areas in the back of your throat. These may be signs of strep throat. SEEK IMMEDIATE MEDICAL CARE IF:   You have a fever.  You develop severe or persistent headache, ear pain, sinus pain, or chest pain.  You develop wheezing, a prolonged cough, cough up blood, or have a change in your usual mucus (if you have chronic lung disease).  You develop sore muscles or a stiff neck. Document Released: 05/11/2001 Document Revised: 02/07/2012 Document Reviewed: 02/20/2014 [employer] Patient Information 2015 [employer]. This information is not intended to replace advice given to you by your health care provider. Make sure you discuss any questions you have with your health care provider. Allergic Rhinitis Allergic rhinitis is when the mucous membranes in the nose respond to allergens. Allergens are particles in the air that cause your body to have an allergic reaction. This causes you to release allergic antibodies. Through a chain of  events, these eventually cause you to release histamine into the blood stream. Although meant to protect the body, it is this release of histamine that causes your discomfort, such as frequent sneezing, congestion, and an itchy, runny nose.  CAUSES  Seasonal allergic rhinitis (hay fever) is caused by pollen allergens that may come from grasses, trees, and weeds. Year-round allergic rhinitis (perennial allergic rhinitis) is caused by allergens such as house dust mites, pet dander, and mold spores.  SYMPTOMS   Nasal stuffiness (congestion).  Itchy, runny nose with sneezing and tearing of the eyes. DIAGNOSIS  Your health care provider can help you determine the allergen or allergens that trigger your symptoms. If you and your health care provider are unable to determine the allergen, skin or blood testing may be used. TREATMENT  Allergic rhinitis does not have a cure, but it can be controlled by:  Medicines and allergy shots (immunotherapy).  Avoiding the allergen. Hay fever may often be treated with antihistamines in pill or nasal spray forms. Antihistamines block the effects of histamine. There are over-the-counter medicines that may help with nasal congestion and swelling around the eyes. Check with your health care provider before taking or giving this medicine.  If avoiding the allergen or the medicine prescribed do not work, there are many new medicines your health care provider can prescribe.   Stronger medicine may be used if initial measures are ineffective. Desensitizing injections can be used if medicine and avoidance does not work. Desensitization is when a patient is given ongoing shots until the body becomes less sensitive to the allergen. Make sure you follow up with your health care provider if problems continue. HOME CARE INSTRUCTIONS It is not possible to completely avoid allergens, but you can reduce your symptoms by taking steps to limit your exposure to them. It helps to know  exactly what you are allergic to so that you can avoid your specific triggers. SEEK MEDICAL CARE IF:   You have a fever.  You develop a cough that does not stop easily (persistent).  You have shortness of breath.  You start wheezing.  Symptoms interfere with normal daily activities. Document Released: 08/10/2001 Document Revised: 11/20/2013 Document Reviewed: 07/23/2013 [employer] Patient Information 2015 [employer]. This information is not intended to replace advice given to you by your health care provider. Make sure you discuss any questions you have with your health care provider.  

## 2015-07-01 ENCOUNTER — Encounter: Payer: Self-pay | Admitting: Obstetrics and Gynecology

## 2015-07-01 ENCOUNTER — Ambulatory Visit (INDEPENDENT_AMBULATORY_CARE_PROVIDER_SITE_OTHER): Payer: BLUE CROSS/BLUE SHIELD | Admitting: Obstetrics and Gynecology

## 2015-07-01 VITALS — BP 138/90 | HR 80 | Resp 16 | Ht 64.0 in | Wt 202.0 lb

## 2015-07-01 DIAGNOSIS — M797 Fibromyalgia: Secondary | ICD-10-CM | POA: Insufficient documentation

## 2015-07-01 DIAGNOSIS — Z124 Encounter for screening for malignant neoplasm of cervix: Secondary | ICD-10-CM | POA: Diagnosis not present

## 2015-07-01 DIAGNOSIS — Z113 Encounter for screening for infections with a predominantly sexual mode of transmission: Secondary | ICD-10-CM

## 2015-07-01 DIAGNOSIS — N920 Excessive and frequent menstruation with regular cycle: Secondary | ICD-10-CM | POA: Diagnosis not present

## 2015-07-01 DIAGNOSIS — E118 Type 2 diabetes mellitus with unspecified complications: Secondary | ICD-10-CM | POA: Insufficient documentation

## 2015-07-01 DIAGNOSIS — E7800 Pure hypercholesterolemia, unspecified: Secondary | ICD-10-CM | POA: Insufficient documentation

## 2015-07-01 DIAGNOSIS — Z01419 Encounter for gynecological examination (general) (routine) without abnormal findings: Secondary | ICD-10-CM | POA: Diagnosis not present

## 2015-07-01 DIAGNOSIS — F329 Major depressive disorder, single episode, unspecified: Secondary | ICD-10-CM

## 2015-07-01 DIAGNOSIS — F419 Anxiety disorder, unspecified: Secondary | ICD-10-CM

## 2015-07-01 DIAGNOSIS — E119 Type 2 diabetes mellitus without complications: Secondary | ICD-10-CM

## 2015-07-01 DIAGNOSIS — I10 Essential (primary) hypertension: Secondary | ICD-10-CM | POA: Diagnosis not present

## 2015-07-01 DIAGNOSIS — G43909 Migraine, unspecified, not intractable, without status migrainosus: Secondary | ICD-10-CM | POA: Insufficient documentation

## 2015-07-01 DIAGNOSIS — F32A Depression, unspecified: Secondary | ICD-10-CM

## 2015-07-01 DIAGNOSIS — Z0000 Encounter for general adult medical examination without abnormal findings: Secondary | ICD-10-CM | POA: Diagnosis not present

## 2015-07-01 LAB — POCT URINALYSIS DIPSTICK
BILIRUBIN UA: NEGATIVE
Blood, UA: NEGATIVE
GLUCOSE UA: NEGATIVE
KETONES UA: NEGATIVE
Leukocytes, UA: NEGATIVE
Nitrite, UA: NEGATIVE
PH UA: 7
Protein, UA: NEGATIVE
SPEC GRAV UA: 1.015
Urobilinogen, UA: NEGATIVE

## 2015-07-01 LAB — CBC
HEMATOCRIT: 39.8 % (ref 36.0–46.0)
Hemoglobin: 13.5 g/dL (ref 12.0–15.0)
MCH: 33.3 pg (ref 26.0–34.0)
MCHC: 33.9 g/dL (ref 30.0–36.0)
MCV: 98.3 fL (ref 78.0–100.0)
MPV: 9.1 fL (ref 8.6–12.4)
Platelets: 474 10*3/uL — ABNORMAL HIGH (ref 150–400)
RBC: 4.05 MIL/uL (ref 3.87–5.11)
RDW: 13.8 % (ref 11.5–15.5)
WBC: 8.4 10*3/uL (ref 4.0–10.5)

## 2015-07-01 LAB — LIPID PANEL
Cholesterol: 204 mg/dL — ABNORMAL HIGH (ref 125–200)
HDL: 30 mg/dL — ABNORMAL LOW (ref >=46)
LDL CALC: 145 mg/dL — AB (ref <130)
TRIGLYCERIDES: 146 mg/dL (ref <150)
Total CHOL/HDL Ratio: 6.8 Ratio — ABNORMAL HIGH (ref <=5.0)
VLDL: 29 mg/dL (ref <30)

## 2015-07-01 LAB — COMPREHENSIVE METABOLIC PANEL
ALBUMIN: 3.6 g/dL (ref 3.6–5.1)
ALT: 8 U/L (ref 6–29)
AST: 13 U/L (ref 10–30)
Alkaline Phosphatase: 72 U/L (ref 33–115)
BUN: 9 mg/dL (ref 7–25)
CALCIUM: 8.8 mg/dL (ref 8.6–10.2)
CO2: 25 mmol/L (ref 20–31)
Chloride: 101 mmol/L (ref 98–110)
Creat: 0.71 mg/dL (ref 0.50–1.10)
Glucose, Bld: 131 mg/dL — ABNORMAL HIGH (ref 65–99)
POTASSIUM: 4.1 mmol/L (ref 3.5–5.3)
Sodium: 137 mmol/L (ref 135–146)
Total Bilirubin: 0.3 mg/dL (ref 0.2–1.2)
Total Protein: 7.1 g/dL (ref 6.1–8.1)

## 2015-07-01 LAB — HEMOGLOBIN A1C
Hgb A1c MFr Bld: 6.9 % — ABNORMAL HIGH (ref <5.7)
MEAN PLASMA GLUCOSE: 151 mg/dL — AB (ref <117)

## 2015-07-01 LAB — HEPATITIS C ANTIBODY: HCV AB: NEGATIVE

## 2015-07-01 LAB — TSH: TSH: 1.726 u[IU]/mL (ref 0.350–4.500)

## 2015-07-01 MED ORDER — FLUOXETINE HCL 20 MG PO CAPS: 20.0000 mg | ORAL_CAPSULE | Freq: Every day | ORAL | Status: DC

## 2015-07-01 NOTE — Patient Instructions (Signed)
EXERCISE AND DIET:  We recommended that you start or continue a regular exercise program for good health. Regular exercise means any activity that makes your heart beat faster and makes you sweat.  We recommend exercising at least 30 minutes per day at least 3 days a week, preferably 4 or 5.  We also recommend a diet low in fat and sugar.  Inactivity, poor dietary choices and obesity can cause diabetes, heart attack, stroke, and kidney damage, among others.    ALCOHOL AND SMOKING:  Women should limit their alcohol intake to no more than 7 drinks/beers/glasses of wine (combined, not each!) per week. Moderation of alcohol intake to this level decreases your risk of breast cancer and liver damage. And of course, no recreational drugs are part of a healthy lifestyle.  And absolutely no smoking or even second hand smoke. Most people know smoking can cause heart and lung diseases, but did you know it also contributes to weakening of your bones? Aging of your skin?  Yellowing of your teeth and nails?  CALCIUM AND VITAMIN D:  Adequate intake of calcium and Vitamin D are recommended.  The recommendations for exact amounts of these supplements seem to change often, but generally speaking 600 mg of calcium (either carbonate or citrate) and 800 units of Vitamin D per day seems prudent. Certain women may benefit from higher intake of Vitamin D.  If you are among these women, your doctor will have told you during your visit.    PAP SMEARS:  Pap smears, to check for cervical cancer or precancers,  have traditionally been done yearly, although recent scientific advances have shown that most women can have pap smears less often.  However, every woman still should have a physical exam from her gynecologist every year. It will include a breast check, inspection of the vulva and vagina to check for abnormal growths or skin changes, a visual exam of the cervix, and then an exam to evaluate the size and shape of the uterus and  ovaries.  And after 40 years of age, a rectal exam is indicated to check for rectal cancers. We will also provide age appropriate advice regarding health maintenance, like when you should have certain vaccines, screening for sexually transmitted diseases, bone density testing, colonoscopy, mammograms, etc.   MAMMOGRAMS:  All women over 40 years old should have a yearly mammogram. Many facilities now offer a "3D" mammogram, which may cost around $50 extra out of pocket. If possible,  we recommend you accept the option to have the 3D mammogram performed.  It both reduces the number of women who will be called back for extra views which then turn out to be normal, and it is better than the routine mammogram at detecting truly abnormal areas.    COLONOSCOPY:  Colonoscopy to screen for colon cancer is recommended for all women at age 50.  We know, you hate the idea of the prep.  We agree, BUT, having colon cancer and not knowing it is worse!!  Colon cancer so often starts as a polyp that can be seen and removed at colonscopy, which can quite literally save your life!  And if your first colonoscopy is normal and you have no family history of colon cancer, most women don't have to have it again for 10 years.  Once every ten years, you can do something that may end up saving your life, right?  We will be happy to help you get it scheduled when you are ready.    Be sure to check your insurance coverage so you understand how much it will cost.  It may be covered as a preventative service at no cost, but you should check your particular policy.     Sonohysterogram A sonohysterogram is a procedure to examine the inside of your uterus. This exam uses sound waves sent to a computer to make real-time pictures of the inside of your uterus. To get the best images, a germ-free, saltwater solution (sterile saline) is injected into your uterus through your vagina. A sonohysterogram can show whether there is scarring or abnormal  growths inside your uterus. It can also show whether your uterus is an abnormal shape or whether the lining is too thin.  LET [facility] CARE PROVIDER KNOW ABOUT:  Any allergies you have.  All medicines you are taking, including vitamins, herbs, eyedrops, creams, and over-the-counter medicines.  Previous problems you or members of your family have had with the use of anesthetics.  Any blood disorders you have.  Previous surgeries you have had.  Medical conditions you have.  The dates of your last period.  Possibility of pregnancy. RISKS AND COMPLICATIONS Generally, a sonohysterogram is a safe procedure. However, as with any procedure, problems can occur. Possible problems include:  Bleeding.  Infection. BEFORE THE PROCEDURE  Your health care provider may have you take an over-the-counter pain medicine.  You may get a prescription for antibiotic medicine.  Your health care provider may give you a pregnancy test before the procedure.  You will empty your bladder. PROCEDURE   You will lie down on the examining table with your knees raised or your feet in stirrups.  Your health care provider may do a pelvic exam before starting the procedure.  A slender, handheld device (transducer) will be lubricated and placed into your vagina.  The transducer will be positioned to send sound waves to your uterus.  The sound waves will bounce back to the transducer. They will be sent to a computer.  The computer will turn the sound waves into live images.  Your health care provider will view the images on a screen during the procedure.  Your health care provider will remove the transducer from your vagina and use an instrument to widen the opening (speculum).  A swab will be used to clean the opening to your uterus (cervix).  A long, thin tube (catheter) will then be placed through your cervix into your uterus.  Your health care provider will fill your uterus with sterile saline  through the catheter. You may feel some cramping.  The speculum will be removed.  The transducer will be placed back in your vagina to take more images. AFTER THE PROCEDURE After the procedure, it is typical to have light bleeding from your vagina, cramping, and watery vaginal discharge.  Document Released: 04/01/2014 Document Reviewed: 10/29/2013 [facility] Patient Information 2015 [place], [place]. This information is not intended to replace advice given to you by your health care provider. Make sure you discuss any questions you have with your health care provider.

## 2015-07-01 NOTE — Progress Notes (Signed)
Patient ID: Eileen Bowen, female   DOB: [birth year], 39 y.o.   MRN: [id number] 39 y.o. [id number] SingleAfrican AmericanF here for annual exam.  Menses q month x 6-9 days (usually 7 day). Saturates a super + tampon in 1.5-2 hours. Longer and heavier in the last 4 years. No cramps, + lower back pain and vaginal pressure. She c/o PMS, starts having mood changes a week prior to her cycle, better with her cycles. Causing problems. In the past she was on Prozac, 40 mg a day. She ran out of it, didn't have insurance coverage. Felt much better on Prozac, wants to restart. Sexually active, same partner x 1 year (off and on for 16 years). Desires STD testing.  Patient's last menstrual period was 06/04/2015.          Sexually active: Yes.    The current method of family planning is tubal ligation.    Exercising: No.  The patient does not participate in regular exercise at present. Smoker:  yes  Health Maintenance: Pap:  2014 WNL per patient History of abnormal Pap:  Yes 2002- colposcopy - repeated PAP 6 months later and PAP was normal.  MMG:  02/2015 per patient Garald Braver)  Colonoscopy:  Never BMD:   Never TDaP:  Up to date per patient Screening Labs: labs drawn today  Hb today: 13.5, Urine today: WNL    reports that she has been smoking Cigarettes.  She has a 10.5 pack-year smoking history. She has never used smokeless tobacco. She reports that she drinks about 0.6 oz of alcohol per week. She reports that she does not use illicit drugs.  Past Medical History  Diagnosis Date  . Diabetes mellitus   . Hypertension   . Hypercholesteremia   . Fibromyalgia   . Migraine   . Dysmenorrhea   . Anxiety   . Depression   . Genital warts     Past Surgical History  Procedure Laterality Date  . Cesarean section    . Tubal ligation    . Cholecystectomy  09/1999  . Colposcopy      Current Outpatient Prescriptions  Medication Sig Dispense Refill  . cetirizine (ZYRTEC ALLERGY) 10 MG tablet Take 1 tablet (10  mg total) by mouth daily. 30 tablet 1  . naproxen (NAPROSYN) 250 MG tablet Take 1 tablet (250 mg total) by mouth 2 (two) times daily with a meal. 30 tablet 0   No current facility-administered medications for this visit.    Family History  Problem Relation Age of Onset  . Diabetes Mother   . Fibromyalgia Sister   . Hypertension Maternal Aunt   . Thyroid disease Maternal Grandmother   . Asthma Son    SH: kids are 80 and 63, both boys, both at home.   ROS:  Pertinent items are noted in HPI.  Otherwise, a comprehensive ROS was negative.  Exam:   BP 138/90 mmHg  Pulse 80  Resp 16  Ht  (1.626 m)  Wt 202 lb (91.627 kg)  BMI 34.66 kg/m2  LMP 06/04/2015  Weight change: @ Height:   Height:  (162.6 cm)  Ht Readings from Last 3 Encounters:  07/01/15  (1.626 m)  10/09/14  (1.626 m)    General appearance: alert, cooperative and appears stated age Head: Normocephalic, without obvious abnormality, atraumatic Neck: no adenopathy, supple, symmetrical, trachea midline and thyroid normal to inspection and palpation Lungs: clear to auscultation bilaterally Breasts: normal appearance, no masses or tenderness Heart: regular rate  and rhythm Abdomen: soft, non-tender; bowel sounds normal; no masses,  no organomegaly Extremities: extremities normal, atraumatic, no cyanosis or edema Skin: Skin color, texture, turgor normal. No rashes or lesions Lymph nodes: Cervical, supraclavicular, and axillary nodes normal. No abnormal inguinal nodes palpated Neurologic: Grossly normal   Pelvic: External genitalia:  no lesions              Urethra:  normal appearing urethra with no masses, tenderness or lesions              Bartholins and Skenes: normal                 Vagina: normal appearing vagina with normal color and discharge, no lesions              Cervix: no lesions              Pap taken: Yes.   Bimanual Exam:  Uterus:  normal size, contour, position,  consistency, mobility, non-tender and anteverted              Adnexa: normal adnexa and no mass, fullness, tenderness               Rectovaginal: Confirms               Anus:  normal sphincter tone, no lesions  Chaperone was present for exam.  A:  Well Woman with normal exam  Screening for STD's  Multiple medical problems, including: diabetes, HTN, fibromyalgia, migraines  Menorrhagia  Depression/Anxiety  P:   Pap with HPV  STD testing  CBC, Ferritin, TSH, HgbA1C, CMP, Lipids  Return for a sonohysterogram  Restart Prozac  F/U in 1 month  Needs a primary MD, names given  Mammogram UTD  Recommend she use condoms for STD protection

## 2015-07-02 LAB — STD PANEL
HEP B S AG: NEGATIVE
HIV 1&2 Ab, 4th Generation: NONREACTIVE

## 2015-07-02 LAB — HEMOGLOBIN, FINGERSTICK: Hemoglobin, fingerstick: 13.5 g/dL (ref 12.0–16.0)

## 2015-07-03 ENCOUNTER — Telehealth: Payer: Self-pay | Admitting: Obstetrics and Gynecology

## 2015-07-03 LAB — IPS N GONORRHOEA AND CHLAMYDIA BY PCR

## 2015-07-03 LAB — IPS PAP TEST WITH HPV

## 2015-07-03 NOTE — Telephone Encounter (Signed)
Spoke with patient. Reviewed benefits for shgm. Patient requested to postpone scheduling until later date. Patient states she will call to schedule - probably September/october.

## 2015-07-07 NOTE — Telephone Encounter (Signed)
39 year old patient with sonohystogram ordered for menorrhagia. How would you like me to follow?

## 2015-07-08 NOTE — Telephone Encounter (Signed)
Please put her on our recall list for about 6 weeks from now to check if she has an appointment, if not we should call her.  Thanks

## 2015-07-10 NOTE — Telephone Encounter (Signed)
Order date adjusted for 6 week follow-up. Alert note added. Encounter closed.

## 2015-07-28 ENCOUNTER — Telehealth: Payer: Self-pay | Admitting: Obstetrics and Gynecology

## 2015-07-28 DIAGNOSIS — N920 Excessive and frequent menstruation with regular cycle: Secondary | ICD-10-CM

## 2015-07-28 NOTE — Telephone Encounter (Signed)
Patient canceled her appointment for re ck. She states she will call back later this week to reschedule.

## 2015-07-30 ENCOUNTER — Ambulatory Visit: Payer: BLUE CROSS/BLUE SHIELD | Admitting: Obstetrics and Gynecology

## 2015-08-13 NOTE — Telephone Encounter (Signed)
Patient cancelled appointment for recheck.  Also needs Sonohysterogram.  History of Tubal Ligation.   Message left to return call to [place] at [phone].

## 2015-08-19 NOTE — Telephone Encounter (Signed)
Message left to return call to [place] at [phone].    

## 2015-08-22 ENCOUNTER — Encounter: Payer: Self-pay | Admitting: Emergency Medicine

## 2015-08-22 NOTE — Telephone Encounter (Signed)
Message left to return call to [place] at [phone].    

## 2015-08-22 NOTE — Telephone Encounter (Signed)
Letter routed to Dr. Oscar La to review.

## 2015-08-25 NOTE — Telephone Encounter (Signed)
Certified letter to patient sent via [place] mail to address of record.  Encounter closed.

## 2015-08-31 ENCOUNTER — Emergency Department (HOSPITAL_COMMUNITY)
Admission: EM | Admit: 2015-08-31 | Discharge: 2015-08-31 | Disposition: A | Discharge status: Home / Self Care | Payer: Worker's Compensation | Attending: Emergency Medicine | Admitting: Emergency Medicine

## 2015-08-31 ENCOUNTER — Encounter (HOSPITAL_COMMUNITY): Payer: Self-pay | Admitting: *Deleted

## 2015-08-31 DIAGNOSIS — I10 Essential (primary) hypertension: Secondary | ICD-10-CM | POA: Insufficient documentation

## 2015-08-31 DIAGNOSIS — F419 Anxiety disorder, unspecified: Secondary | ICD-10-CM | POA: Diagnosis not present

## 2015-08-31 DIAGNOSIS — Z8679 Personal history of other diseases of the circulatory system: Secondary | ICD-10-CM | POA: Insufficient documentation

## 2015-08-31 DIAGNOSIS — Z8619 Personal history of other infectious and parasitic diseases: Secondary | ICD-10-CM | POA: Insufficient documentation

## 2015-08-31 DIAGNOSIS — E119 Type 2 diabetes mellitus without complications: Secondary | ICD-10-CM | POA: Diagnosis not present

## 2015-08-31 DIAGNOSIS — Z79899 Other long term (current) drug therapy: Secondary | ICD-10-CM | POA: Diagnosis not present

## 2015-08-31 DIAGNOSIS — S01111A Laceration without foreign body of right eyelid and periocular area, initial encounter: Secondary | ICD-10-CM | POA: Diagnosis not present

## 2015-08-31 DIAGNOSIS — Z23 Encounter for immunization: Secondary | ICD-10-CM | POA: Insufficient documentation

## 2015-08-31 DIAGNOSIS — W2203XA Walked into furniture, initial encounter: Secondary | ICD-10-CM | POA: Insufficient documentation

## 2015-08-31 DIAGNOSIS — Z8739 Personal history of other diseases of the musculoskeletal system and connective tissue: Secondary | ICD-10-CM | POA: Diagnosis not present

## 2015-08-31 DIAGNOSIS — F329 Major depressive disorder, single episode, unspecified: Secondary | ICD-10-CM | POA: Insufficient documentation

## 2015-08-31 DIAGNOSIS — Y9289 Other specified places as the place of occurrence of the external cause: Secondary | ICD-10-CM | POA: Diagnosis not present

## 2015-08-31 DIAGNOSIS — Z791 Long term (current) use of non-steroidal anti-inflammatories (NSAID): Secondary | ICD-10-CM | POA: Diagnosis not present

## 2015-08-31 DIAGNOSIS — Y998 Other external cause status: Secondary | ICD-10-CM | POA: Insufficient documentation

## 2015-08-31 DIAGNOSIS — Z8742 Personal history of other diseases of the female genital tract: Secondary | ICD-10-CM | POA: Diagnosis not present

## 2015-08-31 DIAGNOSIS — Z720 Tobacco use: Secondary | ICD-10-CM | POA: Diagnosis not present

## 2015-08-31 DIAGNOSIS — Y9389 Activity, other specified: Secondary | ICD-10-CM | POA: Diagnosis not present

## 2015-08-31 MED ORDER — TETANUS-DIPHTH-ACELL PERTUSSIS 5-2.5-18.5 LF-MCG/0.5 IM SUSP
0.5000 mL | Freq: Once | INTRAMUSCULAR | Status: AC
  Administered 2015-08-31: 0.5 mL via INTRAMUSCULAR
  Filled 2015-08-31: qty 0.5

## 2015-08-31 NOTE — Discharge Instructions (Signed)
dermabond will flake off over the next few days.  Try not to pull it off.  May shower and bathe normally. Return here for any new concerns.

## 2015-08-31 NOTE — ED Notes (Signed)
Pt reports increasing headache and right facial pain due to injury.  No acute distress.

## 2015-08-31 NOTE — ED Provider Notes (Signed)
CSN: [id number]     Arrival date & time 08/31/15  1754 History  By signing my name below, I, Octavia Heir, attest that this documentation has been prepared under the direction and in the presence of Sharilyn Sites, PA-C. Electronically Signed: Octavia Heir, ED Scribe. 08/31/2015. 8:15 PM.    Chief Complaint  Patient presents with  . Laceration      The history is provided by the patient. No language interpreter was used.    HPI Comments: Eileen Bowen is a 39 y.o. female who presents to the Emergency Department complaining of a sudden onset head laceration onset 3 hours ago. Pt states she bent over and hit her head on a wooden corner of a dresser. Pt has a laceration on above her right eye brow. She reports the laceration bled for one hour but bleeding is now controlled. She is not up to date on her tetanus shot.  Denies headache, dizziness, confusion, tinnitus, visual disturbance, changes in speech.  No anticoagulation.  VSS.  Past Medical History  Diagnosis Date  . Diabetes mellitus   . Hypertension   . Hypercholesteremia   . Fibromyalgia   . Migraine   . Dysmenorrhea   . Anxiety   . Depression   . Genital warts    Past Surgical History  Procedure Laterality Date  . Cesarean section    . Tubal ligation    . Cholecystectomy  09/1999  . Colposcopy     Family History  Problem Relation Age of Onset  . Diabetes Mother   . Fibromyalgia Sister   . Hypertension Maternal Aunt   . Thyroid disease Maternal Grandmother   . Asthma Son    Social History  Substance Use Topics  . Smoking status: Current Every Day Smoker -- 0.50 packs/day for 21 years    Types: Cigarettes  . Smokeless tobacco: Never Used  . Alcohol Use: 0.6 oz/week    1 Standard drinks or equivalent per week   OB History    Gravida Para Term Preterm AB TAB SAB Ectopic Multiple Living   Review of Systems  Skin: Positive for wound.  All other systems reviewed and are  negative.     Allergies  Review of patient's allergies indicates no known allergies.  Home Medications   Prior to Admission medications   Medication Sig Start Date End Date Taking? Authorizing Provider  cetirizine (ZYRTEC ALLERGY) 10 MG tablet Take 1 tablet (10 mg total) by mouth daily. 06/17/15   Everlene Farrier, PA-C  FLUoxetine (PROZAC) 20 MG capsule Take 1 capsule (20 mg total) by mouth daily. 07/01/15   Romualdo Bolk, MD  naproxen (NAPROSYN) 250 MG tablet Take 1 tablet (250 mg total) by mouth 2 (two) times daily with a meal. 06/17/15   Everlene Farrier, PA-C   Triage vitals: BP 142/89 mmHg  Pulse 89  Temp(Src) 98.1 F (36.7 C) (Oral)  Resp 16  SpO2 100%  LMP 08/28/2015 (Exact Date) Physical Exam  Constitutional: She is oriented to person, place, and time. She appears well-developed and well-nourished.  HENT:  Head: Normocephalic. Head is with laceration.  Mouth/Throat: Oropharynx is clear and moist.  Superficial 1cm laceration of right eyebrow without active bleeding; no bruising, hematoma, or swelling noted; no deformity of orbital rim; mid-face stable; dentition intact; no trismus  Eyes: Conjunctivae and EOM are normal. Pupils are equal, round, and reactive to light.  Neck: Normal range of motion.  Cardiovascular: Normal rate, regular rhythm and normal heart sounds.   Pulmonary/Chest: Effort normal and breath sounds normal.  Abdominal: Soft. Bowel sounds are normal.  Musculoskeletal: Normal range of motion.  Neurological: She is alert and oriented to person, place, and time.  AAOx3, answering questions appropriately; equal strength UE and LE bilaterally; CN grossly intact; moves all extremities appropriately without ataxia; no focal neuro deficits or facial asymmetry appreciated  Skin: Skin is warm and dry.  Psychiatric: She has a normal mood and affect.  Nursing note and vitals reviewed.   ED Course  Procedures   LACERATION REPAIR Performed by: Garlon Hatchet Authorized by: Garlon Hatchet Consent: Verbal consent obtained. Risks and benefits: risks, benefits and alternatives were discussed Consent given by: patient Patient identity confirmed: provided demographic data Prepped and Draped in normal sterile fashion Wound explored  Laceration Location: right eyebrow  Laceration Length: 1cm, superficial  No Foreign Bodies seen or palpated  Anesthesia: none  Local anesthetic: none  Anesthetic total: 0 ml Irrigation method: syringe Amount of cleaning: standard  Skin closure: dermabond  Number of sutures: 0  Technique: n/a  Patient tolerance: Patient tolerated the procedure well with no immediate complications.  DIAGNOSTIC STUDIES: Oxygen Saturation is 100% on RA, normal by my interpretation.  COORDINATION OF CARE:  8:07 PM Discussed treatment plan which includes dermabond with pt at bedside and pt agreed to plan.  Labs Review Labs Reviewed - No data to display  Imaging Review No results found. I have personally reviewed and evaluated these images and lab results as part of my medical decision-making.   EKG Interpretation None      MDM   Final diagnoses:  Eyebrow laceration, right, initial encounter   39 y.o. F with eyebrow laceration after striking had against corner of dresser. No loss of consciousness.lacerations approx 1 cm in length very superficial.  Patient is neurologically intact, no concern for intracranial injury.  Tetanus updated.  Laceration repaired with dermabond, patient tolerated procedure well.  Worker's comp UDS performed prior to discharge.  Patient discharged home in stable condition.    I personally performed the services described in this documentation, which was scribed in my presence. The recorded information has been reviewed and is accurate.  Garlon Hatchet, PA-C 08/31/15 2247  Raeford Razor, MD 09/03/15 [phone]

## 2015-08-31 NOTE — ED Notes (Addendum)
Pt reports around 1630 she bent down to pick something up from ground and hit a wood corner of a dresser, laceration to right eyebrow. Pain 3/10. Bleeding controlled. Bruising noted to right eyelid. Pt is current on tetanus.

## 2015-11-11 ENCOUNTER — Emergency Department (HOSPITAL_COMMUNITY)
Admission: EM | Admit: 2015-11-11 | Discharge: 2015-11-12 | Disposition: A | Discharge status: Home / Self Care | Payer: BLUE CROSS/BLUE SHIELD | Attending: Emergency Medicine | Admitting: Emergency Medicine

## 2015-11-11 DIAGNOSIS — Z8619 Personal history of other infectious and parasitic diseases: Secondary | ICD-10-CM | POA: Insufficient documentation

## 2015-11-11 DIAGNOSIS — M797 Fibromyalgia: Secondary | ICD-10-CM | POA: Insufficient documentation

## 2015-11-11 DIAGNOSIS — Z3202 Encounter for pregnancy test, result negative: Secondary | ICD-10-CM | POA: Insufficient documentation

## 2015-11-11 DIAGNOSIS — Z8742 Personal history of other diseases of the female genital tract: Secondary | ICD-10-CM | POA: Diagnosis not present

## 2015-11-11 DIAGNOSIS — R079 Chest pain, unspecified: Secondary | ICD-10-CM | POA: Diagnosis present

## 2015-11-11 DIAGNOSIS — E119 Type 2 diabetes mellitus without complications: Secondary | ICD-10-CM | POA: Insufficient documentation

## 2015-11-11 DIAGNOSIS — K219 Gastro-esophageal reflux disease without esophagitis: Secondary | ICD-10-CM | POA: Diagnosis not present

## 2015-11-11 DIAGNOSIS — Z791 Long term (current) use of non-steroidal anti-inflammatories (NSAID): Secondary | ICD-10-CM | POA: Insufficient documentation

## 2015-11-11 DIAGNOSIS — F17210 Nicotine dependence, cigarettes, uncomplicated: Secondary | ICD-10-CM | POA: Diagnosis not present

## 2015-11-11 DIAGNOSIS — Z79899 Other long term (current) drug therapy: Secondary | ICD-10-CM | POA: Insufficient documentation

## 2015-11-11 DIAGNOSIS — I10 Essential (primary) hypertension: Secondary | ICD-10-CM | POA: Insufficient documentation

## 2015-11-11 DIAGNOSIS — F419 Anxiety disorder, unspecified: Secondary | ICD-10-CM | POA: Diagnosis not present

## 2015-11-11 DIAGNOSIS — F329 Major depressive disorder, single episode, unspecified: Secondary | ICD-10-CM | POA: Insufficient documentation

## 2015-11-12 ENCOUNTER — Emergency Department (HOSPITAL_COMMUNITY): Payer: BLUE CROSS/BLUE SHIELD

## 2015-11-12 ENCOUNTER — Encounter (HOSPITAL_COMMUNITY): Payer: Self-pay

## 2015-11-12 LAB — I-STAT CHEM 8, ED
BUN: 12 mg/dL (ref 6–20)
CHLORIDE: 99 mmol/L — AB (ref 101–111)
CREATININE: 0.8 mg/dL (ref 0.44–1.00)
Calcium, Ion: 1.3 mmol/L — ABNORMAL HIGH (ref 1.12–1.23)
GLUCOSE: 171 mg/dL — AB (ref 65–99)
HCT: 41 % (ref 36.0–46.0)
HEMOGLOBIN: 13.9 g/dL (ref 12.0–15.0)
POTASSIUM: 4 mmol/L (ref 3.5–5.1)
Sodium: 138 mmol/L (ref 135–145)
TCO2: 29 mmol/L (ref 0–100)

## 2015-11-12 LAB — CBC WITH DIFFERENTIAL/PLATELET
BASOS ABS: 0 10*3/uL (ref 0.0–0.1)
Basophils Relative: 0 %
Eosinophils Absolute: 0.7 10*3/uL (ref 0.0–0.7)
Eosinophils Relative: 7 %
HCT: 37.5 % (ref 36.0–46.0)
HEMOGLOBIN: 12.6 g/dL (ref 12.0–15.0)
LYMPHS PCT: 36 %
Lymphs Abs: 3.4 10*3/uL (ref 0.7–4.0)
MCH: 32.7 pg (ref 26.0–34.0)
MCHC: 33.6 g/dL (ref 30.0–36.0)
MCV: 97.4 fL (ref 78.0–100.0)
MONO ABS: 0.8 10*3/uL (ref 0.1–1.0)
Monocytes Relative: 9 %
NEUTROS ABS: 4.5 10*3/uL (ref 1.7–7.7)
Neutrophils Relative %: 48 %
Platelets: 398 10*3/uL (ref 150–400)
RBC: 3.85 MIL/uL — ABNORMAL LOW (ref 3.87–5.11)
RDW: 13.8 % (ref 11.5–15.5)
WBC: 9.4 10*3/uL (ref 4.0–10.5)

## 2015-11-12 LAB — I-STAT BETA HCG BLOOD, ED (MC, WL, AP ONLY): I-stat hCG, quantitative: <5 m[IU]/mL (ref <5)

## 2015-11-12 LAB — I-STAT TROPONIN, ED: Troponin i, poc: 0.01 ng/mL (ref 0.00–0.08)

## 2015-11-12 MED ORDER — SUCRALFATE 1 GM/10ML PO SUSP: 1.0000 g | Freq: Three times a day (TID) | ORAL | Status: DC

## 2015-11-12 MED ORDER — GI COCKTAIL ~~LOC~~
30.0000 mL | Freq: Once | ORAL | Status: AC
  Administered 2015-11-12: 30 mL via ORAL
  Filled 2015-11-12: qty 30

## 2015-11-12 MED ORDER — RABEPRAZOLE SODIUM 20 MG PO TBEC: 20.0000 mg | DELAYED_RELEASE_TABLET | Freq: Every day | ORAL | Status: DC

## 2015-11-12 NOTE — ED Notes (Signed)
Patient transported to X-ray 

## 2015-11-12 NOTE — ED Notes (Signed)
Pt reports a hx of acid reflux, but states that this feels different.

## 2015-11-12 NOTE — ED Notes (Signed)
Pt complains of chest pain for one week, feels like indigestion but it will not go away, she said tonight her jaw started to hurt and her side started to hurt

## 2015-11-12 NOTE — ED Provider Notes (Signed)
CSN: [id number]     Arrival date & time 11/11/15  2355 History   First MD Initiated Contact with Patient 11/12/15 [phone]     Chief Complaint  Patient presents with  . Chest Pain     (Consider location/radiation/quality/duration/timing/severity/associated sxs/prior Treatment) Patient is a 39 y.o. female presenting with chest pain. The history is provided by the patient.  Chest Pain Pain location:  Epigastric (all the way to the mouth vertically) Pain quality: burning   Pain radiates to the back: no   Pain severity:  Severe Duration:  2 weeks Timing:  Constant Progression:  Unchanged Chronicity:  New Context: not breathing   Relieved by:  Nothing Worsened by:  Nothing tried Ineffective treatments:  None tried Associated symptoms: no abdominal pain, no fever, no palpitations and no shortness of breath     Past Medical History  Diagnosis Date  . Diabetes mellitus   . Hypertension   . Hypercholesteremia   . Fibromyalgia   . Migraine   . Dysmenorrhea   . Anxiety   . Depression   . Genital warts    Past Surgical History  Procedure Laterality Date  . Cesarean section    . Tubal ligation    . Cholecystectomy  09/1999  . Colposcopy     Family History  Problem Relation Age of Onset  . Diabetes Mother   . Fibromyalgia Sister   . Hypertension Maternal Aunt   . Thyroid disease Maternal Grandmother   . Asthma Son    Social History  Substance Use Topics  . Smoking status: Current Every Day Smoker -- 0.50 packs/day for 21 years    Types: Cigarettes  . Smokeless tobacco: Never Used  . Alcohol Use: 0.6 oz/week    1 Standard drinks or equivalent per week   OB History    Gravida Para Term Preterm AB TAB SAB Ectopic Multiple Living   Review of Systems  Constitutional: Negative for fever.  Respiratory: Negative for shortness of breath.   Cardiovascular: Positive for chest pain. Negative for palpitations and leg swelling.  Gastrointestinal: Negative for  abdominal pain.  All other systems reviewed and are negative.     Allergies  Review of patient's allergies indicates no known allergies.  Home Medications   Prior to Admission medications   Medication Sig Start Date End Date Taking? Authorizing Provider  calcium carbonate (TUMS - DOSED IN MG ELEMENTAL CALCIUM) 500 MG chewable tablet Chew 1 tablet by mouth daily.   Yes Historical Provider, MD  Magnesium Hydroxide (MAGNESIA PO) Take 1 tablet by mouth daily.   Yes Historical Provider, MD  Multiple Vitamin (MULTIVITAMIN WITH MINERALS) TABS tablet Take 1 tablet by mouth daily.   Yes Historical Provider, MD  naproxen sodium (ANAPROX) 220 MG tablet Take 220 mg by mouth 2 (two) times daily with a meal.   Yes Historical Provider, MD  cetirizine (ZYRTEC ALLERGY) 10 MG tablet Take 1 tablet (10 mg total) by mouth daily. Patient not taking: Reported on 08/31/2015 06/17/15   Everlene Farrier, PA-C  FLUoxetine (PROZAC) 20 MG capsule Take 1 capsule (20 mg total) by mouth daily. Patient not taking: Reported on 08/31/2015 07/01/15   Romualdo Bolk, MD  naproxen (NAPROSYN) 250 MG tablet Take 1 tablet (250 mg total) by mouth 2 (two) times daily with a meal. Patient not taking: Reported on 10/2/696295284/19/16   Everlene Farrier, PA-C  RABEprazole (ACIPHEX) 20 MG tablet Take  1 tablet (20 mg total) by mouth daily. 11/12/15   Etana Beets, MD  sucralfate (CARAFATE) 1 GM/10ML suspension Take 10 mLs (1 g total) by mouth 4 (four) times daily -  with meals and at bedtime. 11/12/15   Johnedward Brodrick, MD   BP 130/93 mmHg  Pulse 87  Temp(Src) 97.9 F (36.6 C) (Oral)  Resp 16  SpO2 98%  LMP 10/27/2015 Physical Exam  Constitutional: She is oriented to person, place, and time. She appears well-developed and well-nourished. No distress.  HENT:  Head: Normocephalic and atraumatic.  Mouth/Throat: Oropharynx is clear and moist. No oropharyngeal exudate.  Eyes: Conjunctivae and EOM are normal. Pupils are equal, round, and  reactive to light.  Neck: Normal range of motion. Neck supple.  Cardiovascular: Normal rate, regular rhythm and intact distal pulses.   Pulmonary/Chest: Effort normal and breath sounds normal. No respiratory distress. She has no wheezes. She has no rales.  Abdominal: Soft. Bowel sounds are normal. There is no tenderness. There is no rebound and no guarding.  Musculoskeletal: Normal range of motion.  Neurological: She is alert and oriented to person, place, and time.  Skin: Skin is warm and dry.  Psychiatric: She has a normal mood and affect.    ED Course  Procedures (including critical care time) Labs Review Labs Reviewed  CBC WITH DIFFERENTIAL/PLATELET - Abnormal; Notable for the following:    RBC 3.85 (*)    All other components within normal limits  I-STAT CHEM 8, ED - Abnormal; Notable for the following:    Chloride 99 (*)    Glucose, Bld 171 (*)    Calcium, Ion 1.30 (*)    All other components within normal limits  I-STAT TROPOININ, ED  I-STAT BETA HCG BLOOD, ED ([facility], [facility], [facility] ONLY)    Imaging Review Dg Chest 2 View  11/12/2015  CLINICAL DATA:  Acute onset of productive cough. Mid chest pain. Initial encounter. EXAM: CHEST  2 VIEW COMPARISON:  Chest radiograph performed 10/09/2014 FINDINGS: The lungs are well-aerated and clear. There is no evidence of focal opacification, pleural effusion or pneumothorax. The heart is normal in size; the mediastinal contour is within normal limits. No acute osseous abnormalities are seen. IMPRESSION: No acute cardiopulmonary process seen. Electronically Signed   By: Roanna RaiderJeffery  Chang M.D.   On: 11/12/2015 04:52   I have personally reviewed and evaluated these images and lab results as part of my medical decision-making.   EKG Interpretation   Date/Time:  Wednesday November 12 2015 00:03:16 EST Ventricular Rate:  110 PR Interval:  129 QRS Duration: 82 QT Interval:  310 QTC Calculation: 419 R Axis:   33 Text Interpretation:  Sinus tachycardia  Borderline T wave abnormalities  Confirmed by [facility]  MD, Marykathryn Carboni ([id number]) on 11/12/2015 3:32:26 AM      MDM   Final diagnoses:  Gastroesophageal reflux disease without esophagitis    Symptoms consistent with GERD.  Drink OJ and cranberry juice and taking large supplement pills.  Will restart GERD meds.       Cy BlamerApril Jailin Manocchio, MD 11/12/15 [phone]

## 2016-05-26 ENCOUNTER — Encounter (HOSPITAL_COMMUNITY): Payer: Self-pay | Admitting: Emergency Medicine

## 2016-05-26 ENCOUNTER — Emergency Department (HOSPITAL_COMMUNITY)
Admission: EM | Admit: 2016-05-26 | Discharge: 2016-05-26 | Disposition: A | Discharge status: Home / Self Care | Payer: BLUE CROSS/BLUE SHIELD | Attending: Emergency Medicine | Admitting: Emergency Medicine

## 2016-05-26 DIAGNOSIS — R51 Headache: Secondary | ICD-10-CM | POA: Diagnosis present

## 2016-05-26 DIAGNOSIS — Z79899 Other long term (current) drug therapy: Secondary | ICD-10-CM | POA: Insufficient documentation

## 2016-05-26 DIAGNOSIS — I10 Essential (primary) hypertension: Secondary | ICD-10-CM | POA: Diagnosis not present

## 2016-05-26 DIAGNOSIS — E119 Type 2 diabetes mellitus without complications: Secondary | ICD-10-CM | POA: Insufficient documentation

## 2016-05-26 DIAGNOSIS — R110 Nausea: Secondary | ICD-10-CM | POA: Diagnosis not present

## 2016-05-26 DIAGNOSIS — F17210 Nicotine dependence, cigarettes, uncomplicated: Secondary | ICD-10-CM | POA: Diagnosis not present

## 2016-05-26 DIAGNOSIS — F329 Major depressive disorder, single episode, unspecified: Secondary | ICD-10-CM | POA: Insufficient documentation

## 2016-05-26 DIAGNOSIS — R519 Headache, unspecified: Secondary | ICD-10-CM

## 2016-05-26 MED ORDER — KETOROLAC TROMETHAMINE 30 MG/ML IJ SOLN
30.0000 mg | Freq: Once | INTRAMUSCULAR | Status: AC
  Administered 2016-05-26: 30 mg via INTRAVENOUS
  Filled 2016-05-26: qty 1

## 2016-05-26 MED ORDER — DEXAMETHASONE SODIUM PHOSPHATE 10 MG/ML IJ SOLN: 10.0000 mg | Freq: Once | INTRAMUSCULAR | Status: DC

## 2016-05-26 MED ORDER — SUMATRIPTAN SUCCINATE 100 MG PO TABS: 100.0000 mg | ORAL_TABLET | ORAL | Status: DC | PRN

## 2016-05-26 MED ORDER — SODIUM CHLORIDE 0.9 % IV BOLUS (SEPSIS)
1000.0000 mL | Freq: Once | INTRAVENOUS | Status: AC
  Administered 2016-05-26: 1000 mL via INTRAVENOUS

## 2016-05-26 MED ORDER — PROCHLORPERAZINE EDISYLATE 5 MG/ML IJ SOLN
10.0000 mg | Freq: Once | INTRAMUSCULAR | Status: AC
  Administered 2016-05-26: 10 mg via INTRAVENOUS
  Filled 2016-05-26: qty 2

## 2016-05-26 NOTE — ED Notes (Signed)
Pt c/o headache to neck, posterior head, anterior head onset Monday. Pt reports history of similar headaches, but states this is worse than typical.

## 2016-05-26 NOTE — Discharge Instructions (Signed)
Take imitrex as prescribed at onset of migraine. Otherwise take Exedrine as needed. Follow up with primary care doctor or headache specialist.   Migraine Headache A migraine headache is an intense, throbbing pain on one or both sides of your head. A migraine can last for 30 minutes to several hours. CAUSES  The exact cause of a migraine headache is not always known. However, a migraine may be caused when nerves in the brain become irritated and release chemicals that cause inflammation. This causes pain. Certain things may also trigger migraines, such as:  Alcohol.  Smoking.  Stress.  Menstruation.  Aged cheeses.  Foods or drinks that contain nitrates, glutamate, aspartame, or tyramine.  Lack of sleep.  Chocolate.  Caffeine.  Hunger.  Physical exertion.  Fatigue.  Medicines used to treat chest pain (nitroglycerine), birth control pills, estrogen, and some blood pressure medicines. SIGNS AND SYMPTOMS  Pain on one or both sides of your head.  Pulsating or throbbing pain.  Severe pain that prevents daily activities.  Pain that is aggravated by any physical activity.  Nausea, vomiting, or both.  Dizziness.  Pain with exposure to bright lights, loud noises, or activity.  General sensitivity to bright lights, loud noises, or smells. Before you get a migraine, you may get warning signs that a migraine is coming (aura). An aura may include:  Seeing flashing lights.  Seeing bright spots, halos, or zigzag lines.  Having tunnel vision or blurred vision.  Having feelings of numbness or tingling.  Having trouble talking.  Having muscle weakness. DIAGNOSIS  A migraine headache is often diagnosed based on:  Symptoms.  Physical exam.  A CT scan or MRI of your head. These imaging tests cannot diagnose migraines, but they can help rule out other causes of headaches. TREATMENT Medicines may be given for pain and nausea. Medicines can also be given to help prevent  recurrent migraines.  HOME CARE INSTRUCTIONS  Only take over-the-counter or prescription medicines for pain or discomfort as directed by your health care provider. The use of long-term narcotics is not recommended.  Lie down in a dark, quiet room when you have a migraine.  Keep a journal to find out what may trigger your migraine headaches. For example, write down:  What you eat and drink.  How much sleep you get.  Any change to your diet or medicines.  Limit alcohol consumption.  Quit smoking if you smoke.  Get 7-9 hours of sleep, or as recommended by your health care provider.  Limit stress.  Keep lights dim if bright lights bother you and make your migraines worse. SEEK IMMEDIATE MEDICAL CARE IF:   Your migraine becomes severe.  You have a fever.  You have a stiff neck.  You have vision loss.  You have muscular weakness or loss of muscle control.  You start losing your balance or have trouble walking.  You feel faint or pass out.  You have severe symptoms that are different from your first symptoms. MAKE SURE YOU:   Understand these instructions.  Will watch your condition.  Will get help right away if you are not doing well or get worse.   This information is not intended to replace advice given to you by your health care provider. Make sure you discuss any questions you have with your health care provider.   Document Released: 11/15/2005 Document Revised: 12/06/2014 Document Reviewed: 07/23/2013 Elsevier Interactive Patient Education [employer]2016 [employer]

## 2016-05-26 NOTE — ED Provider Notes (Signed)
CSN: [id number]     Arrival date & time 05/26/16  1311 History   First MD Initiated Contact with Patient 05/26/16 1416     Chief Complaint  Patient presents with  . Headache     (Consider location/radiation/quality/duration/timing/severity/associated sxs/prior Treatment) HPI Eileen Bowen is a 40 y.o. female with history of hypertension, diabetes, fibromyalgia, migraine headaches, presents emergency department complaining of a headache. Patient states this headache started 2 days ago. States she used to take preventative medications but currently does not have a primary care doctor. She states she usually takes Excedrin and if that is not helpful she takes ibuprofen. She states normally she is able to treat her own headache at home. States this time no relief with over-the-counter medications. States pain is to the left side of the head. Radiates around to the back. Pain is sharp. She is sensitive to lights and sounds. Onset of headache was gradual. Denies any blurred vision. No numbness or weakness in extremities. Similar to prior headaches.  Past Medical History  Diagnosis Date  . Diabetes mellitus   . Hypertension   . Hypercholesteremia   . Fibromyalgia   . Migraine   . Dysmenorrhea   . Anxiety   . Depression   . Genital warts    Past Surgical History  Procedure Laterality Date  . Cesarean section    . Tubal ligation    . Cholecystectomy  09/1999  . Colposcopy     Family History  Problem Relation Age of Onset  . Diabetes Mother   . Fibromyalgia Sister   . Hypertension Maternal Aunt   . Thyroid disease Maternal Grandmother   . Asthma Son    Social History  Substance Use Topics  . Smoking status: Current Every Day Smoker -- 0.50 packs/day for 21 years    Types: Cigarettes  . Smokeless tobacco: Never Used  . Alcohol Use: 0.6 oz/week    1 Standard drinks or equivalent per week   OB History    Gravida Para Term Preterm AB TAB SAB Ectopic Multiple Living   2 2 2        2      Review of Systems  Constitutional: Negative for fever and chills.  Eyes: Positive for photophobia.  Respiratory: Negative for cough, chest tightness and shortness of breath.   Cardiovascular: Negative for chest pain, palpitations and leg swelling.  Gastrointestinal: Positive for nausea. Negative for vomiting, abdominal pain and diarrhea.  Musculoskeletal: Negative for myalgias, arthralgias, neck pain and neck stiffness.  Skin: Negative for rash.  Neurological: Positive for headaches. Negative for dizziness and weakness.  All other systems reviewed and are negative.     Allergies  Review of patient's allergies indicates no known allergies.  Home Medications   Prior to Admission medications   Medication Sig Start Date End Date Taking? Authorizing Provider  Magnesium Hydroxide (MAGNESIA PO) Take 1 tablet by mouth daily.   Yes Historical Provider, MD  Multiple Vitamin (MULTIVITAMIN WITH MINERALS) TABS tablet Take 1 tablet by mouth daily.   Yes Historical Provider, MD  naproxen sodium (ANAPROX) 220 MG tablet Take 220-440 mg by mouth 2 (two) times daily as needed.    Yes Historical Provider, MD  cetirizine (ZYRTEC ALLERGY) 10 MG tablet Take 1 tablet (10 mg total) by mouth daily. Patient not taking: Reported on 08/31/2015 06/17/15   Everlene FarrierWilliam Dansie, PA-C  FLUoxetine (PROZAC) 20 MG capsule Take 1 capsule (20 mg total) by mouth daily. Patient not taking: Reported on 08/31/2015 07/01/15  Romualdo BolkJill Evelyn Jertson, MD  RABEprazole (ACIPHEX) 20 MG tablet Take 1 tablet (20 mg total) by mouth daily. Patient not taking: Reported on 05/26/2016 11/12/15   April Palumbo, MD  sucralfate (CARAFATE) 1 GM/10ML suspension Take 10 mLs (1 g total) by mouth 4 (four) times daily -  with meals and at bedtime. Patient not taking: Reported on 05/26/2016 11/12/15   April Palumbo, MD   BP 150/89 mmHg  Pulse 80  Temp(Src) 98.2 F (36.8 C) (Oral)  Resp 18  SpO2 100% Physical Exam  Constitutional: She is oriented  to person, place, and time. She appears well-developed and well-nourished. No distress.  HENT:  Head: Normocephalic.  Eyes: Conjunctivae and EOM are normal. Pupils are equal, round, and reactive to light.  Neck: Normal range of motion. Neck supple.  Cardiovascular: Normal rate, regular rhythm and normal heart sounds.   Pulmonary/Chest: Effort normal and breath sounds normal. No respiratory distress. She has no wheezes. She has no rales.  Abdominal: Soft. Bowel sounds are normal. She exhibits no distension. There is no tenderness. There is no rebound.  Musculoskeletal: She exhibits no edema.  Neurological: She is alert and oriented to person, place, and time. No cranial nerve deficit. Coordination normal.  5/5 and equal upper and lower extremity strength bilaterally. Equal grip strength bilaterally. Normal finger to nose and heel to shin. No pronator drift. Gait normal  Skin: Skin is warm and dry.  Psychiatric: She has a normal mood and affect. Her behavior is normal.  Nursing note and vitals reviewed.   ED Course  Procedures (including critical care time) Labs Review Labs Reviewed - No data to display  Imaging Review No results found. I have personally reviewed and evaluated these images and lab results as part of my medical decision-making.   EKG Interpretation None      MDM   Final diagnoses:  Nonintractable headache, unspecified chronicity pattern, unspecified headache type   Patient is an emergency department with typical for her migraine headache, unrelieved with over-the-counter medications. Currently does not have a primary care doctor. Will try migraine cocktail, fluids. Ordered Compazine and Toradol.   Patient's headache improved, she is worried about rebound headache which she has had in the past. I will give her Decadron prior to discharge. Otherwise patient states she is much better and ready for discharge home. She is afebrile, mildly hypertensive, otherwise normal  vital signs. Doubt meningitis. No head trauma. Does not think she is a further imaging. Follow-up with primary care doctor, return precautions discussed. Patient asked for Imitrex, will prescribed.  Filed Vitals:   05/26/16 1309 05/26/16 1533  BP: 148/93 150/89  Pulse: 83 80  Temp: 98.1 F (36.7 C) 98.2 F (36.8 C)  TempSrc: Oral Oral  Resp: 16 18  SpO2: 100% 100%       Eileen Crumbleatyana Lizzie Cokley, PA-C 05/26/16 2040  Derwood KaplanAnkit Nanavati, MD 05/27/16 0000

## 2016-08-16 ENCOUNTER — Emergency Department (HOSPITAL_COMMUNITY)
Admission: EM | Admit: 2016-08-16 | Discharge: 2016-08-16 | Disposition: A | Discharge status: Home / Self Care | Payer: BLUE CROSS/BLUE SHIELD | Attending: Emergency Medicine | Admitting: Emergency Medicine

## 2016-08-16 ENCOUNTER — Encounter (HOSPITAL_COMMUNITY): Payer: Self-pay | Admitting: Emergency Medicine

## 2016-08-16 DIAGNOSIS — R197 Diarrhea, unspecified: Secondary | ICD-10-CM | POA: Diagnosis not present

## 2016-08-16 DIAGNOSIS — I10 Essential (primary) hypertension: Secondary | ICD-10-CM | POA: Diagnosis not present

## 2016-08-16 DIAGNOSIS — F17210 Nicotine dependence, cigarettes, uncomplicated: Secondary | ICD-10-CM | POA: Insufficient documentation

## 2016-08-16 DIAGNOSIS — R195 Other fecal abnormalities: Secondary | ICD-10-CM

## 2016-08-16 DIAGNOSIS — E119 Type 2 diabetes mellitus without complications: Secondary | ICD-10-CM | POA: Insufficient documentation

## 2016-08-16 DIAGNOSIS — R1033 Periumbilical pain: Secondary | ICD-10-CM | POA: Diagnosis not present

## 2016-08-16 DIAGNOSIS — R1084 Generalized abdominal pain: Secondary | ICD-10-CM | POA: Diagnosis not present

## 2016-08-16 DIAGNOSIS — R110 Nausea: Secondary | ICD-10-CM | POA: Diagnosis not present

## 2016-08-16 LAB — COMPREHENSIVE METABOLIC PANEL
ALBUMIN: 3.8 g/dL (ref 3.5–5.0)
ALK PHOS: 69 U/L (ref 38–126)
ALT: 10 U/L — AB (ref 14–54)
AST: 15 U/L (ref 15–41)
Anion gap: 8 (ref 5–15)
BILIRUBIN TOTAL: 0.5 mg/dL (ref 0.3–1.2)
BUN: 9 mg/dL (ref 6–20)
CALCIUM: 9.1 mg/dL (ref 8.9–10.3)
CO2: 24 mmol/L (ref 22–32)
Chloride: 103 mmol/L (ref 101–111)
Creatinine, Ser: 0.8 mg/dL (ref 0.44–1.00)
GFR calc Af Amer: >60 mL/min (ref >60)
GFR calc non Af Amer: >60 mL/min (ref >60)
GLUCOSE: 155 mg/dL — AB (ref 65–99)
Potassium: 4.4 mmol/L (ref 3.5–5.1)
SODIUM: 135 mmol/L (ref 135–145)
TOTAL PROTEIN: 8.1 g/dL (ref 6.5–8.1)

## 2016-08-16 LAB — CBC
HCT: 41.5 % (ref 36.0–46.0)
HEMOGLOBIN: 14.1 g/dL (ref 12.0–15.0)
MCH: 33.3 pg (ref 26.0–34.0)
MCHC: 34 g/dL (ref 30.0–36.0)
MCV: 98.1 fL (ref 78.0–100.0)
PLATELETS: 397 10*3/uL (ref 150–400)
RBC: 4.23 MIL/uL (ref 3.87–5.11)
RDW: 13.1 % (ref 11.5–15.5)
WBC: 7.7 10*3/uL (ref 4.0–10.5)

## 2016-08-16 LAB — LIPASE, BLOOD: Lipase: 21 U/L (ref 11–51)

## 2016-08-16 MED ORDER — ONDANSETRON HCL 4 MG PO TABS: 4.0000 mg | ORAL_TABLET | Freq: Four times a day (QID) | ORAL | 0 refills | Status: DC

## 2016-08-16 MED ORDER — ONDANSETRON 8 MG PO TBDP
8.0000 mg | ORAL_TABLET | Freq: Once | ORAL | Status: AC
  Administered 2016-08-16: 8 mg via ORAL
  Filled 2016-08-16: qty 1

## 2016-08-16 MED ORDER — DICYCLOMINE HCL 20 MG PO TABS: 20.0000 mg | ORAL_TABLET | Freq: Four times a day (QID) | ORAL | 0 refills | Status: DC

## 2016-08-16 NOTE — ED Triage Notes (Signed)
Pt c/o generalized abdominal pain, nausea, and loose stools x 3 weeks. Pt has hx of abdominal issues but sts this is different. Pt A&Ox4 and ambulatory. Pt denies vomiting, fevers. Pt sts she can't eat without having immediate loose stool.

## 2016-08-16 NOTE — ED Notes (Signed)
Bed: [id number] Expected date:  Expected time:  Means of arrival:  Comments: [place]

## 2016-08-16 NOTE — ED Notes (Signed)
Pt refused urine

## 2016-08-16 NOTE — ED Notes (Signed)
Pt states she needs to leave. I will speak with PA

## 2016-08-16 NOTE — ED Provider Notes (Signed)
[facility]-EMERGENCY DEPT Provider Note   CSN: [id number] Arrival date & time: 08/16/16  1224     History   Chief Complaint Chief Complaint  Patient presents with  . Abdominal Pain  . Nausea    HPI Eileen Bowen is a 40 y.o. female with history of IBS, GERD, fibromyalgia, anxiety, cholecystectomy who presents with a 3 with history of generalized, severe abdominal pain, nausea, and loose stools. Patient's pain is constant, however worse postprandially. Patient reports that her pain is located periumbilical when she has not eaten in a while, and then she states it moves from her left lower quadrant around to her back. She states it feels like she feels pain as her food makes to her digestive system. Patient describes her pain as stabbing, twisting, cramping. She states she has experienced this before with her IBS, however has not been this severe. Patient has had pain over her C-section scar as well, which is not new for her. Patient has had loose stools, but no diarrhea. Patient thinks she may have had blood in her stool, but she is not sure. Patient denies any chest pain, shortness of breath, urinary symptoms. Patient has not taken any medications for this. Patient has taken Bentyl in the past with good relief.  HPI  Past Medical History:  Diagnosis Date  . Anxiety   . Depression   . Diabetes mellitus   . Dysmenorrhea   . Fibromyalgia   . Genital warts   . Hypercholesteremia   . Hypertension   . Migraine     Patient Active Problem List   Diagnosis Date Noted  . Diabetes mellitus (HCC)   . Hypertension   . Hypercholesteremia   . Fibromyalgia   . Migraine   . Anxiety   . Depression     Past Surgical History:  Procedure Laterality Date  . CESAREAN SECTION    . CHOLECYSTECTOMY  09/1999  . COLPOSCOPY    . TUBAL LIGATION      OB History    Gravida Para Term Preterm AB Living   2 2 2     2    SAB TAB Ectopic Multiple Live Births           2       Home Medications     Prior to Admission medications   Medication Sig Start Date End Date Taking? Authorizing Provider  Magnesium Hydroxide (MAGNESIA PO) Take 1 tablet by mouth daily.   Yes Historical Provider, MD  Multiple Vitamin (MULTIVITAMIN WITH MINERALS) TABS tablet Take 1 tablet by mouth daily.   Yes Historical Provider, MD  naproxen sodium (ANAPROX) 220 MG tablet Take 220-440 mg by mouth 2 (two) times daily as needed.    Yes Historical Provider, MD  SUMAtriptan (IMITREX) 100 MG tablet Take 1 tablet (100 mg total) by mouth every 2 (two) hours as needed for migraine. May repeat in 2 hours if headache persists or recurs. 05/26/16  Yes Tatyana Kirichenko, PA-C  cetirizine (ZYRTEC ALLERGY) 10 MG tablet Take 1 tablet (10 mg total) by mouth daily. Patient not taking: Reported on 08/31/2015 06/17/15   Everlene Farrier, PA-C  dicyclomine (BENTYL) 20 MG tablet Take 1 tablet (20 mg total) by mouth 4 (four) times daily. 08/16/16   Emi Holes, PA-C  FLUoxetine (PROZAC) 20 MG capsule Take 1 capsule (20 mg total) by mouth daily. Patient not taking: Reported on 08/31/2015 07/01/15   Romualdo Bolk, MD  ondansetron (ZOFRAN) 4 MG tablet Take 1 tablet (4 mg  total) by mouth every 6 (six) hours. 08/16/16   Emi Holes, PA-C  sucralfate (CARAFATE) 1 GM/10ML suspension Take 10 mLs (1 g total) by mouth 4 (four) times daily -  with meals and at bedtime. Patient not taking: Reported on 05/26/2016 11/12/15   April Palumbo, MD    Family History Family History  Problem Relation Age of Onset  . Diabetes Mother   . Fibromyalgia Sister   . Hypertension Maternal Aunt   . Thyroid disease Maternal Grandmother   . Asthma Son     Social History Social History  Substance Use Topics  . Smoking status: Current Every Day Smoker    Packs/day: 0.50    Years: 21.00    Types: Cigarettes  . Smokeless tobacco: Never Used  . Alcohol use 0.6 oz/week    1 Standard drinks or equivalent per week     Allergies   Review of patient's  allergies indicates no known allergies.   Review of Systems Review of Systems  Constitutional: Negative for chills and fever.  HENT: Negative for facial swelling and sore throat.   Respiratory: Negative for shortness of breath.   Cardiovascular: Negative for chest pain.  Gastrointestinal: Positive for abdominal pain and nausea. Negative for vomiting.  Genitourinary: Negative for dysuria.  Musculoskeletal: Negative for back pain.  Skin: Negative for rash and wound.  Neurological: Negative for headaches.  Psychiatric/Behavioral: The patient is not nervous/anxious.      Physical Exam Updated Vital Signs BP (!) 145/103   Pulse 74   Temp 97.8 F (36.6 C) (Oral)   Resp 18   SpO2 97%   Physical Exam  Constitutional: She appears well-developed and well-nourished. No distress.  HENT:  Head: Normocephalic and atraumatic.  Mouth/Throat: Oropharynx is clear and moist. No oropharyngeal exudate.  Eyes: Conjunctivae are normal. Pupils are equal, round, and reactive to light. Right eye exhibits no discharge. Left eye exhibits no discharge. No scleral icterus.  Neck: Normal range of motion. Neck supple. No thyromegaly present.  Cardiovascular: Normal rate, regular rhythm, normal heart sounds and intact distal pulses.  Exam reveals no gallop and no friction rub.   No murmur heard. Pulmonary/Chest: Effort normal and breath sounds normal. No stridor. No respiratory distress. She has no wheezes. She has no rales.  Abdominal: Soft. Bowel sounds are normal. She exhibits no distension. There is tenderness in the periumbilical area and left upper quadrant. There is no rebound, no guarding, no CVA tenderness, no tenderness at McBurney's point and negative Murphy's sign.    Musculoskeletal: She exhibits no edema.       Back:  Lymphadenopathy:    She has no cervical adenopathy.  Neurological: She is alert. Coordination normal.  Skin: Skin is warm and dry. No rash noted. She is not diaphoretic. No  pallor.  Psychiatric: She has a normal mood and affect.  Nursing note and vitals reviewed.    ED Treatments / Results  Labs (all labs ordered are listed, but only abnormal results are displayed) Labs Reviewed  COMPREHENSIVE METABOLIC PANEL - Abnormal; Notable for the following:       Result Value   Glucose, Bld 155 (*)    ALT 10 (*)    All other components within normal limits  LIPASE, BLOOD  CBC  URINALYSIS, ROUTINE W REFLEX MICROSCOPIC (NOT AT [facility])  POC URINE PREG, ED    EKG  EKG Interpretation None       Radiology No results found.  Procedures Procedures (including critical care time)  Medications Ordered in ED Medications  ondansetron (ZOFRAN-ODT) disintegrating tablet 8 mg (8 mg Oral Given 08/16/16 1845)     Initial Impression / Assessment and Plan / ED Course  I have reviewed the triage vital signs and the nursing notes.  Pertinent labs & imaging results that were available during my care of the patient were reviewed by me and considered in my medical decision making (see chart for details).  Clinical Course    After discussion with the patient with shared decision-making, the patient decided she did not want to do a CT scan or any further testing that was not done in triage (CBC, CMP, lipase). Patient had waited 5 hours and did not want to stay any longer because she is pretty sure that her pain is related to her IBS. Although I recommended CT abdomen and pelvis, I think this is reasonable considering CBC, CMP, lipase unremarkable and chronicity of the problem. I will discharge patient home with Zofran, Bentyl with follow-up to gastroenterology. Patient also advised to follow up and establish care with PCP. Return precautions discussed. Patient vitals stable throughout ED course and discharged in satisfactory condition. I discussed patient case with Dr. Clarene DukeLittle who agrees with plan.   Final Clinical Impressions(s) / ED Diagnoses   Final diagnoses:    Generalized abdominal pain  Loose stools    New Prescriptions Discharge Medication List as of 08/16/2016  6:28 PM    START taking these medications   Details  dicyclomine (BENTYL) 20 MG tablet Take 1 tablet (20 mg total) by mouth 4 (four) times daily., Starting Mon 08/16/2016, Print    ondansetron (ZOFRAN) 4 MG tablet Take 1 tablet (4 mg total) by mouth every 6 (six) hours., Starting Mon 08/16/2016, Print         Emi Holeslexandra M Padraig Nhan, PA-C 08/16/16 2022    Laurence Spatesachel Morgan Little, MD 08/17/16 [phone]

## 2016-08-16 NOTE — Discharge Instructions (Signed)
Medications: Bentyl, Zofran  Treatment: Take Zofran every 6-8 hours as needed for nausea and vomiting. Take Bentyl up to 4 times daily for your abdominal pain and spasms.  Follow-up: Please follow-up with gastroenterology as outlined in discharge paperwork. You can call the number circled on your discharge paperwork to follow up and establish care with a primary care provider. Please return to the emergency department if you develop any new or worsening symptoms.

## 2016-12-09 IMAGING — DX DG CHEST 2V
2 series · 2 of 2 positions shown · non-contrast
Comparison: Chest radiograph performed 10/09/2014

CLINICAL DATA: Acute onset of productive cough. Mid chest pain.
Initial encounter.

EXAM:
CHEST  2 VIEW

[chest pa]
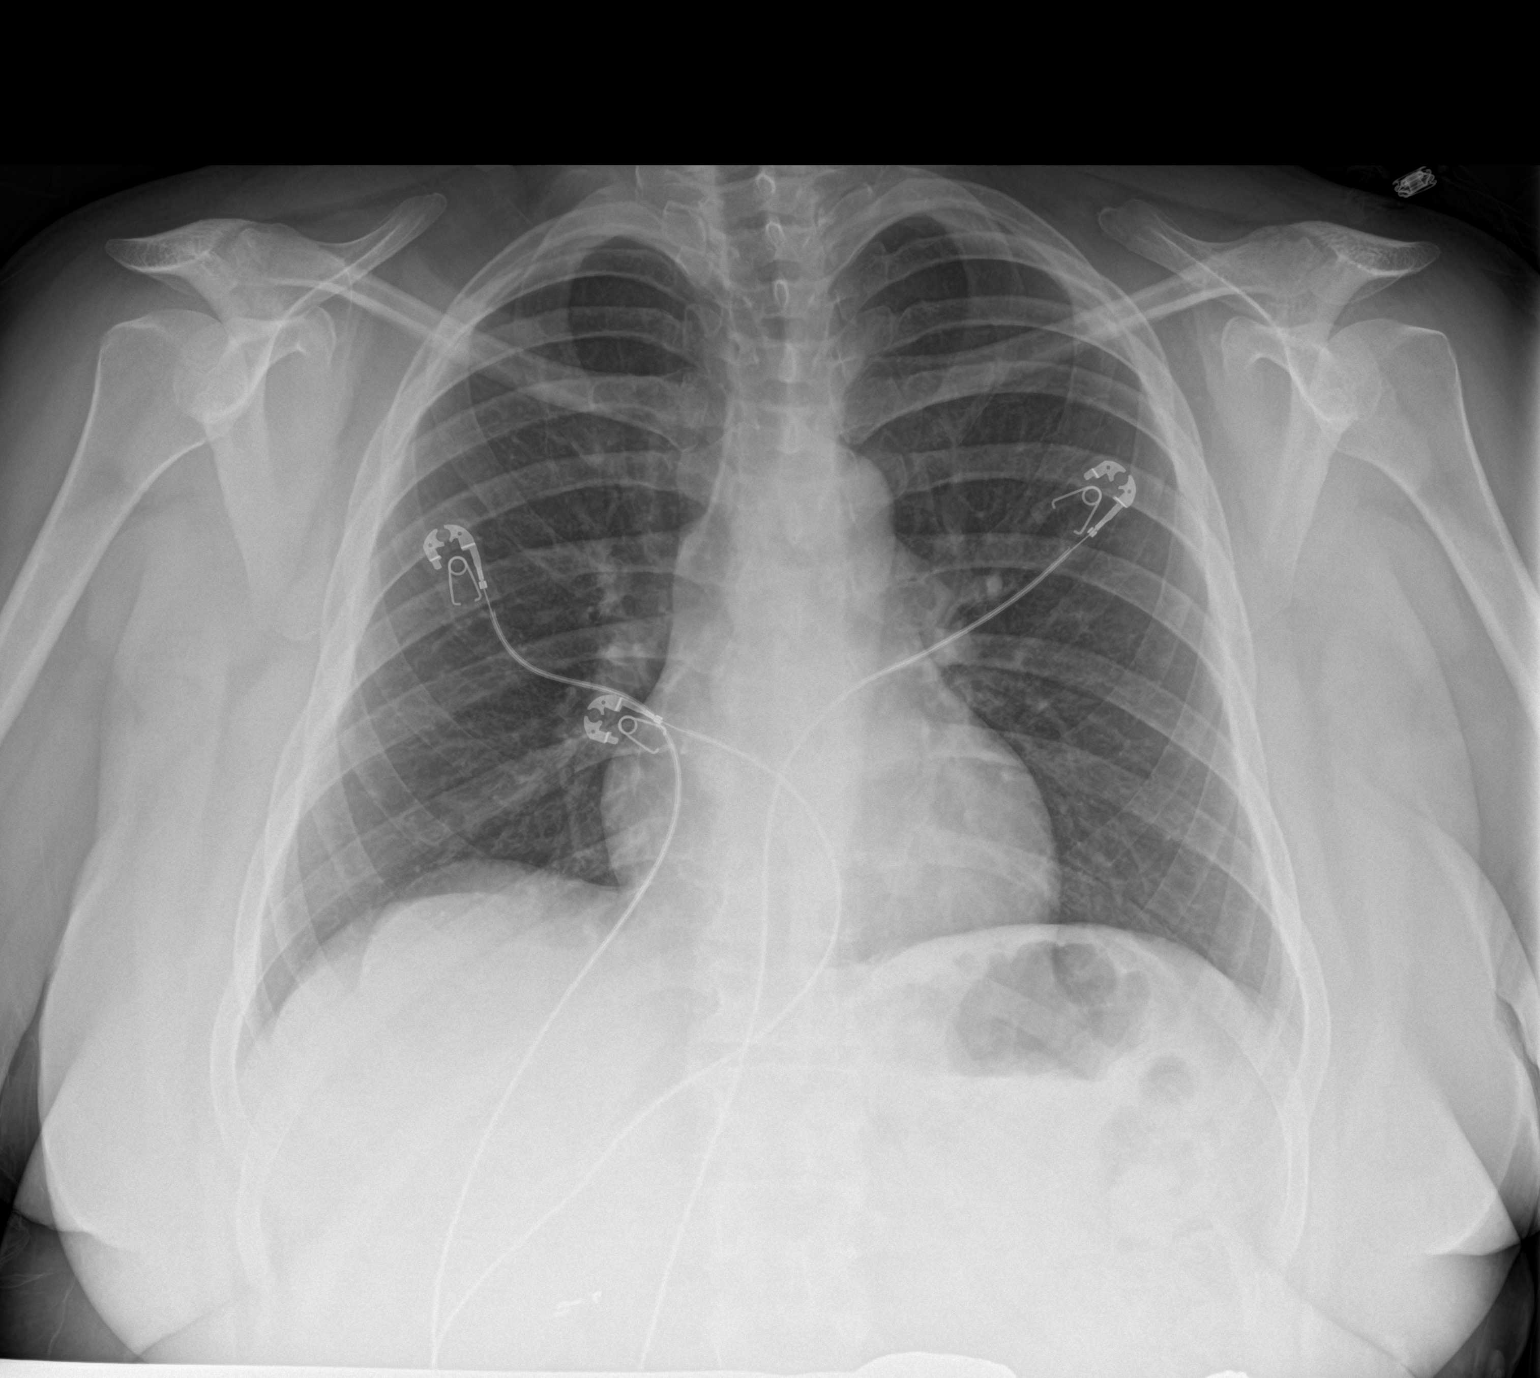

[chest lat]
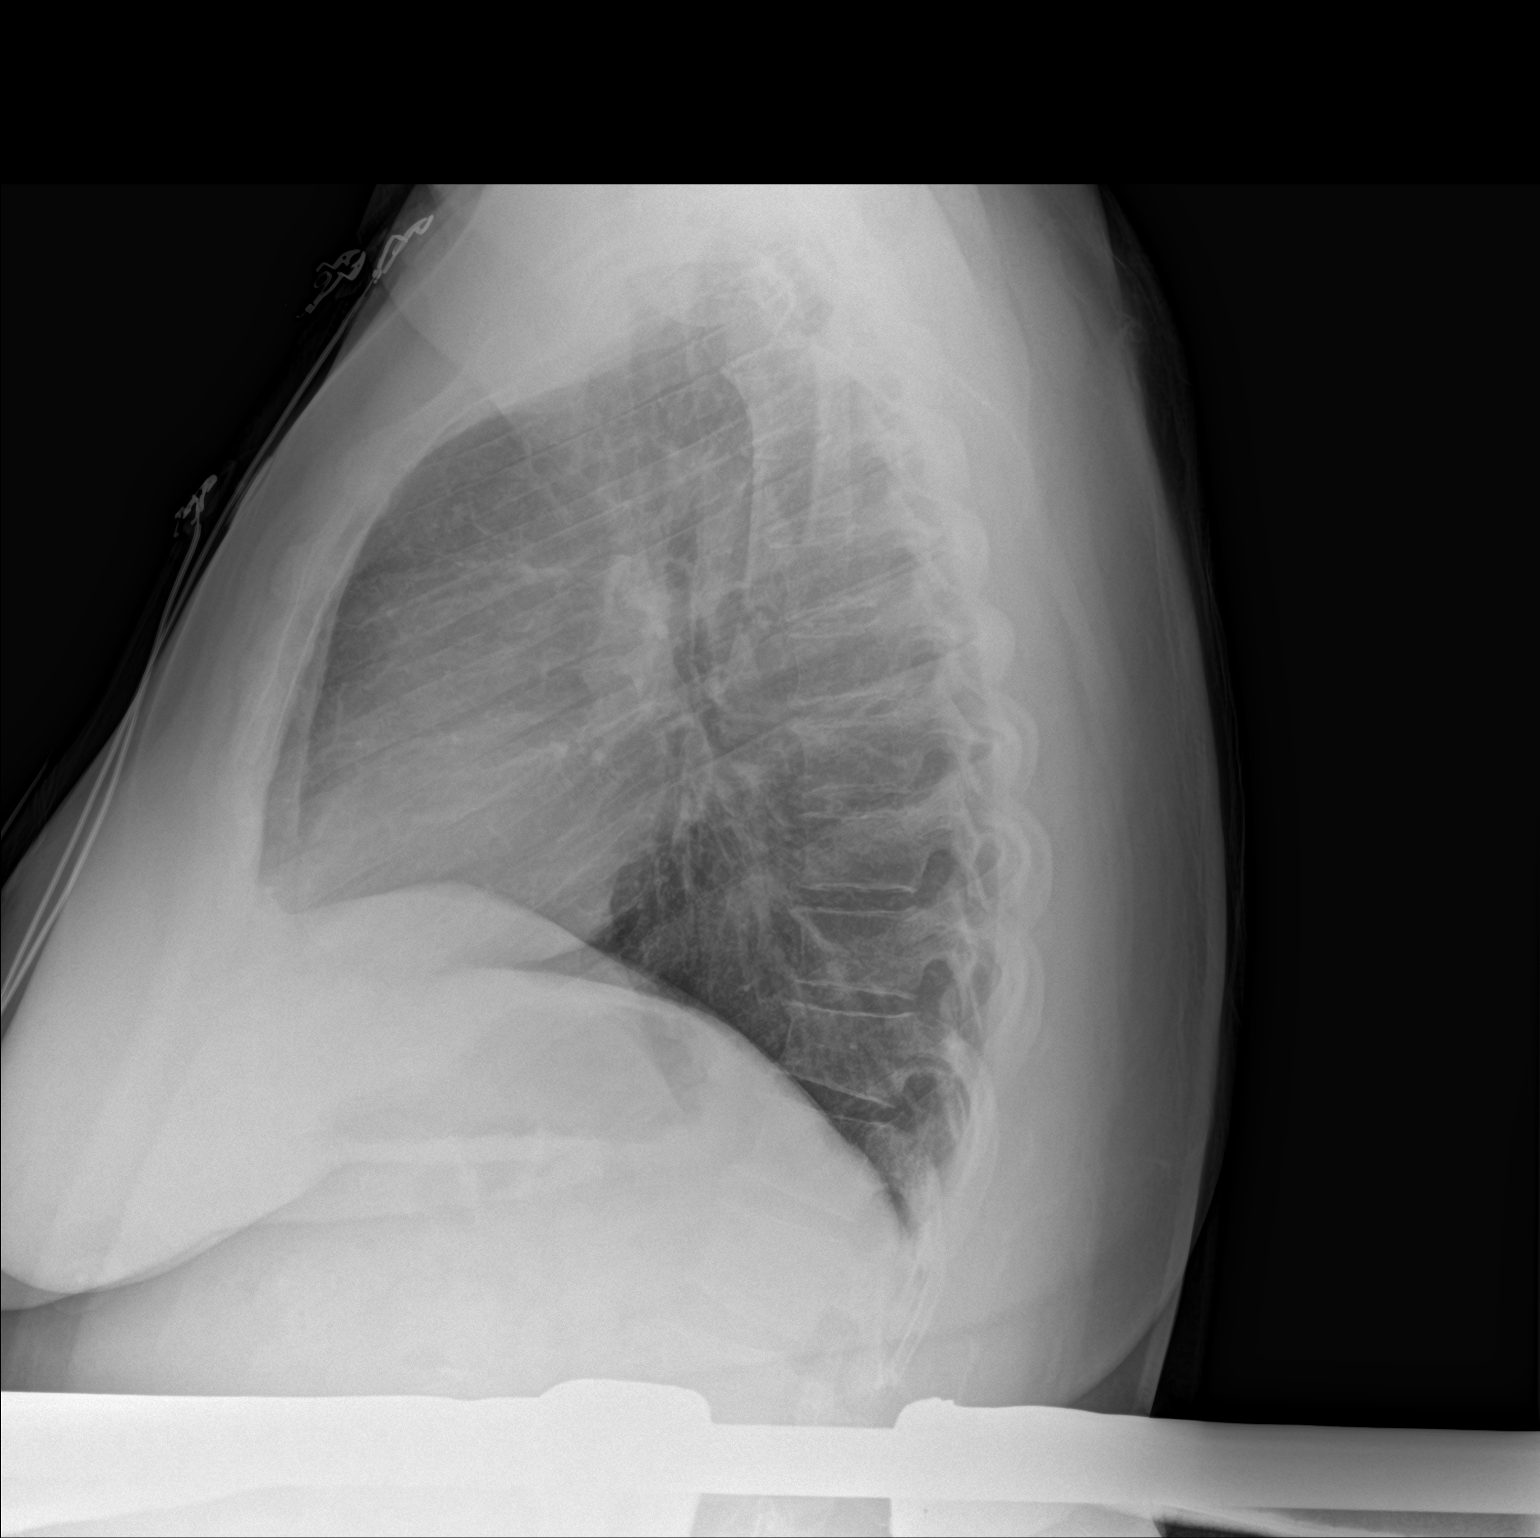

[2 of 2 positions shown; findings below may reference images not displayed]

FINDINGS: The lungs are well-aerated and clear. There is no evidence of focal
opacification, pleural effusion or pneumothorax.

The heart is normal in size; the mediastinal contour is within
normal limits. No acute osseous abnormalities are seen.
IMPRESSION: No acute cardiopulmonary process seen.

## 2017-04-10 ENCOUNTER — Emergency Department (HOSPITAL_COMMUNITY)
Admission: EM | Admit: 2017-04-10 | Discharge: 2017-04-11 | Disposition: A | Discharge status: Home / Self Care | Payer: Managed Care, Other (non HMO) | Attending: Emergency Medicine | Admitting: Emergency Medicine

## 2017-04-10 ENCOUNTER — Encounter (HOSPITAL_COMMUNITY): Payer: Self-pay | Admitting: Emergency Medicine

## 2017-04-10 DIAGNOSIS — Z79899 Other long term (current) drug therapy: Secondary | ICD-10-CM | POA: Insufficient documentation

## 2017-04-10 DIAGNOSIS — F918 Other conduct disorders: Secondary | ICD-10-CM | POA: Insufficient documentation

## 2017-04-10 DIAGNOSIS — F17210 Nicotine dependence, cigarettes, uncomplicated: Secondary | ICD-10-CM | POA: Insufficient documentation

## 2017-04-10 DIAGNOSIS — F4325 Adjustment disorder with mixed disturbance of emotions and conduct: Secondary | ICD-10-CM | POA: Diagnosis present

## 2017-04-10 DIAGNOSIS — I10 Essential (primary) hypertension: Secondary | ICD-10-CM | POA: Insufficient documentation

## 2017-04-10 DIAGNOSIS — R4689 Other symptoms and signs involving appearance and behavior: Secondary | ICD-10-CM

## 2017-04-10 DIAGNOSIS — R45851 Suicidal ideations: Secondary | ICD-10-CM

## 2017-04-10 DIAGNOSIS — F419 Anxiety disorder, unspecified: Secondary | ICD-10-CM

## 2017-04-10 DIAGNOSIS — R45850 Homicidal ideations: Secondary | ICD-10-CM | POA: Insufficient documentation

## 2017-04-10 DIAGNOSIS — F329 Major depressive disorder, single episode, unspecified: Secondary | ICD-10-CM

## 2017-04-10 DIAGNOSIS — F411 Generalized anxiety disorder: Secondary | ICD-10-CM | POA: Diagnosis present

## 2017-04-10 DIAGNOSIS — E119 Type 2 diabetes mellitus without complications: Secondary | ICD-10-CM | POA: Insufficient documentation

## 2017-04-10 LAB — RAPID URINE DRUG SCREEN, HOSP PERFORMED
Amphetamines: NOT DETECTED
BARBITURATES: NOT DETECTED
Benzodiazepines: NOT DETECTED
Cocaine: NOT DETECTED
OPIATES: NOT DETECTED
TETRAHYDROCANNABINOL: POSITIVE — AB

## 2017-04-10 LAB — CBC
HCT: 41 % (ref 36.0–46.0)
HEMOGLOBIN: 14.1 g/dL (ref 12.0–15.0)
MCH: 33.3 pg (ref 26.0–34.0)
MCHC: 34.4 g/dL (ref 30.0–36.0)
MCV: 96.9 fL (ref 78.0–100.0)
Platelets: 394 10*3/uL (ref 150–400)
RBC: 4.23 MIL/uL (ref 3.87–5.11)
RDW: 13.5 % (ref 11.5–15.5)
WBC: 10.2 10*3/uL (ref 4.0–10.5)

## 2017-04-10 LAB — COMPREHENSIVE METABOLIC PANEL
ALK PHOS: 70 U/L (ref 38–126)
ALT: 11 U/L — AB (ref 14–54)
AST: 21 U/L (ref 15–41)
Albumin: 3.9 g/dL (ref 3.5–5.0)
Anion gap: 8 (ref 5–15)
BUN: 7 mg/dL (ref 6–20)
CALCIUM: 9.1 mg/dL (ref 8.9–10.3)
CO2: 24 mmol/L (ref 22–32)
CREATININE: 0.78 mg/dL (ref 0.44–1.00)
Chloride: 102 mmol/L (ref 101–111)
GFR calc Af Amer: >60 mL/min (ref >60)
Glucose, Bld: 162 mg/dL — ABNORMAL HIGH (ref 65–99)
Potassium: 3.8 mmol/L (ref 3.5–5.1)
Sodium: 134 mmol/L — ABNORMAL LOW (ref 135–145)
Total Bilirubin: 0.3 mg/dL (ref 0.3–1.2)
Total Protein: 8.4 g/dL — ABNORMAL HIGH (ref 6.5–8.1)

## 2017-04-10 LAB — ACETAMINOPHEN LEVEL: Acetaminophen (Tylenol), Serum: <10 ug/mL — ABNORMAL LOW (ref 10–30)

## 2017-04-10 LAB — ETHANOL

## 2017-04-10 LAB — SALICYLATE LEVEL: Salicylate Lvl: <7 mg/dL (ref 2.8–30.0)

## 2017-04-10 MED ORDER — CLONIDINE HCL 0.1 MG PO TABS
0.1000 mg | ORAL_TABLET | Freq: Two times a day (BID) | ORAL | Status: DC
  Administered 2017-04-11: 0.1 mg via ORAL
  Filled 2017-04-10: qty 1

## 2017-04-10 MED ORDER — ACETAMINOPHEN 325 MG PO TABS: 650.0000 mg | ORAL_TABLET | ORAL | Status: DC | PRN

## 2017-04-10 MED ORDER — IBUPROFEN 200 MG PO TABS: 600.0000 mg | ORAL_TABLET | Freq: Three times a day (TID) | ORAL | Status: DC | PRN

## 2017-04-10 MED ORDER — CLONIDINE HCL 0.1 MG PO TABS
0.2000 mg | ORAL_TABLET | Freq: Once | ORAL | Status: AC
  Administered 2017-04-10: 0.2 mg via ORAL
  Filled 2017-04-10: qty 2

## 2017-04-10 NOTE — ED Notes (Signed)
Bed: [id number] Expected date:  Expected time:  Means of arrival:  Comments: Katrinka BlazingSmith
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
## 2017-04-10 NOTE — ED Notes (Addendum)
Pt currently talking to TTS
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
## 2017-04-10 NOTE — ED Notes (Signed)
Bed: [facility] Expected date:  Expected time:  Means of arrival:  Comments: 

## 2017-04-10 NOTE — ED Notes (Signed)
SBAR Report received from previous nurse. Pt received calm and visible on unit. Pt denies current SI/ HI, A/V H, depression, anxiety, or pain at this time, and appears otherwise stable and free of distress. Pt informed of current plan. Pt reminded of camera surveillance, q 15 min rounds, and rules of the milieu. Will continue to assess.
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
## 2017-04-11 DIAGNOSIS — F411 Generalized anxiety disorder: Secondary | ICD-10-CM

## 2017-04-11 DIAGNOSIS — F329 Major depressive disorder, single episode, unspecified: Secondary | ICD-10-CM

## 2017-04-11 DIAGNOSIS — F17210 Nicotine dependence, cigarettes, uncomplicated: Secondary | ICD-10-CM

## 2017-04-11 DIAGNOSIS — F4325 Adjustment disorder with mixed disturbance of emotions and conduct: Secondary | ICD-10-CM

## 2017-04-11 MED ORDER — FLUOXETINE HCL 20 MG PO CAPS: 20.0000 mg | ORAL_CAPSULE | Freq: Every day | ORAL | 0 refills | Status: DC

## 2017-04-11 MED ORDER — FLUOXETINE HCL 20 MG PO CAPS
20.0000 mg | ORAL_CAPSULE | Freq: Every day | ORAL | Status: DC
  Administered 2017-04-11: 20 mg via ORAL
  Filled 2017-04-11: qty 1

## 2017-04-11 NOTE — Discharge Instructions (Signed)
For your ongoing mental health needs, you are advised to follow up with [facility].  New and returning patients are seen at their walk-in clinic.  Walk-in hours are Monday - Friday from 8:00 am - 3:00 pm.  Walk-in patients are seen on a first come, first served basis.  Try to arrive as early as possible for he best chance of being seen the same day: ° °     [facility] °     [address] °     [place], [place] [address] °     [phone] °
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
## 2017-04-11 NOTE — ED Notes (Signed)
Pt discharged home. Discharged instructions read to pt who verbalized understanding. All belongings returned to pt who signed for same. Denies SI/HI, is not delusional and not responding to internal stimuli. Escorted pt to the ED exit.    
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
## 2017-04-11 NOTE — Progress Notes (Signed)
CSW provided RN with shelter list and bus pass for patient.   Stacy GardnerErin Zelda Reames, LCSWA Clinical Social Worker [phone]

## 2017-04-11 NOTE — [facility] Assessment (Signed)
[facility] Assessment Progress Note  Per Mojeed Akintayo, MD, this pt does not require psychiatric hospitalization at this time.  Pt is to be discharged from [facility] with recommendation to follow up with [facility].  This has been included in pt's discharge instructions.  Pt's nurse, Diane, has been notified.  Jaimere Feutz, MA Triage Specialist [phone]     
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
## 2017-04-28 ENCOUNTER — Inpatient Hospital Stay (INDEPENDENT_AMBULATORY_CARE_PROVIDER_SITE_OTHER): Payer: Self-pay | Admitting: Physician Assistant

## 2017-05-18 ENCOUNTER — Ambulatory Visit (INDEPENDENT_AMBULATORY_CARE_PROVIDER_SITE_OTHER): Payer: Self-pay | Admitting: Physician Assistant

## 2017-05-18 ENCOUNTER — Encounter (INDEPENDENT_AMBULATORY_CARE_PROVIDER_SITE_OTHER): Payer: Self-pay | Admitting: Physician Assistant

## 2017-05-18 VITALS — BP 128/88 | HR 86 | Temp 98.3°F | Ht 64.0 in | Wt 205.8 lb

## 2017-05-18 DIAGNOSIS — G894 Chronic pain syndrome: Secondary | ICD-10-CM

## 2017-05-18 DIAGNOSIS — R739 Hyperglycemia, unspecified: Secondary | ICD-10-CM

## 2017-05-18 DIAGNOSIS — E118 Type 2 diabetes mellitus with unspecified complications: Secondary | ICD-10-CM

## 2017-05-18 DIAGNOSIS — F419 Anxiety disorder, unspecified: Secondary | ICD-10-CM

## 2017-05-18 LAB — POCT GLYCOSYLATED HEMOGLOBIN (HGB A1C): HEMOGLOBIN A1C: 7

## 2017-05-18 MED ORDER — METFORMIN HCL 500 MG PO TABS: 500.0000 mg | ORAL_TABLET | Freq: Two times a day (BID) | ORAL | 1 refills | Status: DC

## 2017-05-18 MED ORDER — HYDROXYZINE HCL 25 MG PO TABS: 25.0000 mg | ORAL_TABLET | Freq: Every day | ORAL | 1 refills | Status: DC

## 2017-05-18 MED ORDER — DULOXETINE HCL 30 MG PO CPEP: 30.0000 mg | ORAL_CAPSULE | Freq: Every day | ORAL | 2 refills | Status: DC

## 2017-05-18 MED FILL — ?HYDROXYZINE HCL 25 MG TAB: 25 MG | 30 days supply | Qty: 30 | Fill #0

## 2017-05-18 MED FILL — ?DULOXETINE HCL DR 30 MG CA: 30 MG | 30 days supply | Qty: 30 | Fill #0

## 2017-05-18 MED FILL — ?METFORMIN HCL 500MG TABLET: 500 | 30 days supply | Qty: 60 | Fill #0
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
## 2017-05-18 NOTE — Patient Instructions (Addendum)
Major Depressive Disorder, Adult Major depressive disorder (MDD) is a mental health condition. MDD often makes you feel sad, hopeless, or helpless. MDD can also cause symptoms in your body. MDD can affect your:  Work.  School.  Relationships.  Other normal activities.  MDD can range from mild to very bad. It may occur once (single episode MDD). It can also occur many times (recurrent MDD). The main symptoms of MDD often include:  Feeling sad, depressed, or irritable most of the time.  Loss of interest.  MDD symptoms also include:  Sleeping too much or too little.  Eating too much or too little.  A change in your weight.  Feeling tired (fatigue) or having low energy.  Feeling worthless.  Feeling guilty.  Trouble making decisions.  Trouble thinking clearly.  Thoughts of suicide or harming others.  Feeling weak.  Feeling agitated.  Keeping yourself from being around other people (isolation).  Follow these instructions at home: Activity  Do these things as told by your doctor: ? Go back to your normal activities. ? Exercise regularly. ? Spend time outdoors. Alcohol  Talk with your doctor about how alcohol can affect your antidepressant medicines.  Do not drink alcohol. Or, limit how much alcohol you drink. ? This means no more than 1 drink a day for nonpregnant women and 2 drinks a day for men. One drink equals one of these:  12 oz of beer.  5 oz of wine.  1 oz of hard liquor. General instructions  Take over-the-counter and prescription medicines only as told by your doctor.  Eat a healthy diet.  Get plenty of sleep.  Find activities that you enjoy. Make time to do them.  Think about joining a support group. Your doctor may be able to suggest a group for you.  Keep all follow-up visits as told by your doctor. This is important. Where to find more information:  The First American on Mental Illness: ? www.nami.org  U.S. [employer] of  Mental Health: ? http://www.maynard.net/  National Suicide Prevention Lifeline: ? 815-767-0099. This is free, 24-hour help. Contact a doctor if:  Your symptoms get worse.  You have new symptoms. Get help right away if:  You self-harm.  You see, hear, taste, smell, or feel things that are not present (hallucinate). If you ever feel like you may hurt yourself or others, or have thoughts about taking your own life, get help right away. You can go to your nearest emergency department or call:  Your local emergency services (911 in the U.S.).  A suicide crisis helpline, such as the National Suicide Prevention Lifeline: ? 530-167-6340. This is open 24 hours a day.  This information is not intended to replace advice given to you by your health care provider. Make sure you discuss any questions you have with your health care provider. Document Released: 10/27/2015 Document Revised: 08/01/2016 Document Reviewed: 08/01/2016 Elsevier Interactive Patient Education  2017 Elsevier Inc.  Myofascial Pain Syndrome and Fibromyalgia Myofascial pain syndrome and fibromyalgia are both pain disorders. This pain may be felt mainly in your muscles.  Myofascial pain syndrome: ? Always has trigger points or tender points in the muscle that will cause pain when pressed. The pain may come and go. ? Usually affects your neck, upper back, and shoulder areas. The pain often radiates into your arms and hands.  Fibromyalgia: ? Has muscle pains and tenderness that come and go. ? Is often associated with fatigue and sleep disturbances. ? Has trigger points. ? Tends  to be long-lasting (chronic), but is not life-threatening.  Fibromyalgia and myofascial pain are not the same. However, they often occur together. If you have both conditions, each can make the other worse. Both are common and can cause enough pain and fatigue to make day-to-day activities difficult. What are the causes? The exact causes of  fibromyalgia and myofascial pain are not known. People with certain gene types may be more likely to develop fibromyalgia. Some factors can be triggers for both conditions, such as:  Spine disorders.  Arthritis.  Severe injury (trauma) and other physical stressors.  Being under a lot of stress.  A medical illness.  What are the signs or symptoms? Fibromyalgia The main symptom of fibromyalgia is widespread pain and tenderness in your muscles. This can vary over time. Pain is sometimes described as stabbing, shooting, or burning. You may have tingling or numbness, too. You may also have sleep problems and fatigue. You may wake up feeling tired and groggy (fibro fog). Other symptoms may include:  Bowel and bladder problems.  Headaches.  Visual problems.  Problems with odors and noises.  Depression or mood changes.  Painful menstrual periods (dysmenorrhea).  Dry skin or eyes.  Myofascial pain syndrome Symptoms of myofascial pain syndrome include:  Tight, ropy bands of muscle.  Uncomfortable sensations in muscular areas, such as: ? Aching. ? Cramping. ? Burning. ? Numbness. ? Tingling. ? Muscle weakness.  Trouble moving certain muscles freely (range of motion).  How is this diagnosed? There are no specific tests to diagnose fibromyalgia or myofascial pain syndrome. Both can be hard to diagnose because their symptoms are common in many other conditions. Your health care provider may suspect one or both of these conditions based on your symptoms and medical history. Your health care provider will also do a physical exam. The key to diagnosing fibromyalgia is having pain, fatigue, and other symptoms for more than three months that cannot be explained by another condition. The key to diagnosing myofascial pain syndrome is finding trigger points in muscles that are tender and cause pain elsewhere in your body (referred pain). How is this treated? Treating fibromyalgia and  myofascial pain often requires a team of health care providers. This usually starts with your primary provider and a physical therapist. You may also find it helpful to work with alternative health care providers, such as massage therapists or acupuncturists. Treatment for fibromyalgia may include medicines. This may include nonsteroidal anti-inflammatory drugs (NSAIDs), along with other medicines. Treatment for myofascial pain may also include:  NSAIDs.  Cooling and stretching of muscles.  Trigger point injections.  Sound wave (ultrasound) treatments to stimulate muscles.  Follow these instructions at home:  Take medicines only as directed by your health care provider.  Exercise as directed by your health care provider or physical therapist.  Try to avoid stressful situations.  Practice relaxation techniques to control your stress. You may want to try: ? Biofeedback. ? Visual imagery. ? Hypnosis. ? Muscle relaxation. ? Yoga. ? Meditation.  Talk to your health care provider about alternative treatments, such as acupuncture or massage treatment.  Maintain a healthy lifestyle. This includes eating a healthy diet and getting enough sleep.  Consider joining a support group.  Do not do activities that stress or strain your muscles. That includes repetitive motions and heavy lifting. Where to find more information:  National Fibromyalgia Association: www.fmaware.org  Arthritis Foundation: www.arthritis.org  American Chronic Pain Association: GumSearch.nlwww.theacpa.org/condition/myofascial-pain Contact a health care provider if:  You have new  symptoms.  Your symptoms get worse.  You have side effects from your medicines.  You have trouble sleeping.  Your condition is causing depression or anxiety. This information is not intended to replace advice given to you by your health care provider. Make sure you discuss any questions you have with your health care provider. Document  Released: 11/15/2005 Document Revised: 04/22/2016 Document Reviewed: 08/21/2014 Elsevier Interactive Patient Education  2018 [employer].   [employer]  Advocacy/Legal Legal Aid [place]:  [phone]  /  [phone]  Family Justice Center:  212-073-9153  Family Service of the [facility] 24-hr Crisis line:  (267) 616-3987  [facility], [place]:  ([phone]  Court Watch (custody):  ([phone]  [employer] Law Clinic:   ([phone]    Baby & Breastfeeding Car Seat Inspection @ Various [place] Equities trader.- call [phone]  [facility] Health Lactation  [phone]  [facility] Lactation [phone]  WIC: [phone] ([place]);  [phone] ([place])  [place] League:  939-679-8803   Childcare [place] Child Development: 2486969430 [facility]) / [phone] ([place])  - Child Care Resources/ Referrals/ Scholarships  - Head Start/ Early Head Start (call or apply online)  [place] DHHS: [place] Pre-K :  [phone] / [phone]   Employment / Job Search [employer] of [place]: [phone] / [address] [place]   Works Career Center (JobLink): ([phone] ([place]) / [phone] ([place])  Triad Engineer, materials Center: [phone] / [phone]  [facility] Job & Career Center: [phone]  DHHS Work First: 225-052-5583 ([place]) / [phone] ([place])  StepUp Ministry [place]:  (410)387-9055   Financial Assistance [place] Ministry:  854-477-2413  Salvation Army: 4637938996  Dominica Severin Network (furniture):  (386)540-9037  [facility] Helping Hands: 204-760-7707  Low Income Energy Assistance  520 228 4563   Food Assistance DHHS- SNAP/ Food Stamps: 6055582555  WIC: Manley Mason7150213565 ;  [place] 203 149 1455  Layne Benton Book- Free Meals  Little Blue Book- Free Food Pantries  During the summer, text "FOOD" to 517001   General Health / Clinics (Adults) Orange Card (for Adults) through [facility]: [phone]    [facility]:   ([phone]  [facility] Health & Wellness:   [phone]  Health Department:  [phone]  [facility] Community Health:  ([phone] / [phone]  Planned Parenthood of [place]:   9541950564  [facility]:   [phone]   Housing [place] Housing Coalition:   7258400696  [facility] Housing Authority:  7401363333  Affordable Housing Managemnt:  8574269524   Immigrant/ Refugee Center for [facility]):  (606)301-4314  Faith Action International House:  432-173-0167  [employer]als Institute:  (364)677-6042  Parks Ranger Services:  (413)627-5912  African Services Coalition:  (505) 611-1010   LGBTQ YouthSAFE  www.youthsafe[place].org  PFLAG  (989)306-7052 / info@[place] Project:  361-031-2555   Mental Health/ Substance Use Family Service of the [facility]  223-439-1911  [facility] Behavioral Health:  (408) 884-7337 or 1-806-731-4522  [facility] of Care:  [phone]  Journeys Counseling:  2147974492  [facility] Care Services:  [phone]  Vesta Mixer (walk-ins)  (571)788-6400 / [address]  [place]:  (248)586-2636  Alcoholics Anonymous:  4127512587  Narcotics Anonymous:  878-749-8274  Quit Smoking Hotline:  800-QUIT-NOW 313 002 6222)   Parenting Children's Home Society:  903-384-4992  [facility]: Education Center & Support Groups:  973-220-9375  YWCA: 5670283567  [employer]: Bringing Out the Best:  407-074-1016               Thriving at Three (Hispanic  families): (682)472-7178 (Family Service of the [place]):  669 [phone]  Parents as Teachers:  386-585-5717  [place] Child Development- Learning Together (Immigrants): 820-884-3608   Poison Control 737-888-4145  Sports & Recreation [employer] Open Doors Application: https://www.rich.com/  [place] of [place] Recreation Centers: http://www.[place].gov/index.aspx?page=3615   Special  Needs Family Support Network:  9566221221  Autism Society of [place]:   718-002-5876 2707104004 or (650)202-3075 /  (510)613-6988  [facility]:  (562) [phone]  ARC of [place]:  4423730584  Children's Developmental Service Agency (CDSA):  236-099-8131  [facility] (Care Coordination for Children):  220-097-2612   Transportation Medicaid Transportation: (914) 175-8465 to apply  [place]allie Piles Authority: 289-309-5838 (reduced-fare bus ID to Medicaid/ Medicare/ Orange Card)  SCAT Paratransit services: Eligible riders only, call 940-007-1070 for application   Tutoring/Mentoring Black Child Development Institute: (407) 854-3037  [facility] Brothers/ Big Sisters: 272-018-2544 540-449-6738 ([place])  ACES through child's school: 575-428-9991  [employer] Achievers: contact your local Y  SHIELD Mentor Program: [phone]

## 2017-05-19 ENCOUNTER — Other Ambulatory Visit (INDEPENDENT_AMBULATORY_CARE_PROVIDER_SITE_OTHER): Payer: Self-pay | Admitting: Physician Assistant

## 2017-05-19 DIAGNOSIS — R768 Other specified abnormal immunological findings in serum: Secondary | ICD-10-CM

## 2017-05-19 LAB — CBC WITH DIFFERENTIAL/PLATELET
BASOS: 1 %
Basophils Absolute: 0.1 10*3/uL (ref 0.0–0.2)
EOS (ABSOLUTE): 0.4 10*3/uL (ref 0.0–0.4)
EOS: 5 %
HEMOGLOBIN: 12.9 g/dL (ref 11.1–15.9)
Hematocrit: 38.5 % (ref 34.0–46.6)
IMMATURE GRANS (ABS): 0 10*3/uL (ref 0.0–0.1)
IMMATURE GRANULOCYTES: 0 %
Lymphocytes Absolute: 2.4 10*3/uL (ref 0.7–3.1)
Lymphs: 33 %
MCH: 33.3 pg — ABNORMAL HIGH (ref 26.6–33.0)
MCHC: 33.5 g/dL (ref 31.5–35.7)
MCV: 100 fL — AB (ref 79–97)
MONOCYTES: 9 %
MONOS ABS: 0.7 10*3/uL (ref 0.1–0.9)
NEUTROS PCT: 52 %
Neutrophils Absolute: 3.8 10*3/uL (ref 1.4–7.0)
Platelets: 336 10*3/uL (ref 150–379)
RBC: 3.87 x10E6/uL (ref 3.77–5.28)
RDW: 13.6 % (ref 12.3–15.4)
WBC: 7.3 10*3/uL (ref 3.4–10.8)

## 2017-05-19 LAB — COMPREHENSIVE METABOLIC PANEL
ALBUMIN: 3.8 g/dL (ref 3.5–5.5)
ALT: 7 IU/L (ref 0–32)
AST: 13 IU/L (ref 0–40)
Albumin/Globulin Ratio: 1.1 — ABNORMAL LOW (ref 1.2–2.2)
Alkaline Phosphatase: 93 IU/L (ref 39–117)
BUN/Creatinine Ratio: 11 (ref 9–23)
BUN: 9 mg/dL (ref 6–24)
Bilirubin Total: <0.2 mg/dL (ref 0.0–1.2)
CALCIUM: 9.3 mg/dL (ref 8.7–10.2)
CO2: 23 mmol/L (ref 20–29)
CREATININE: 0.82 mg/dL (ref 0.57–1.00)
Chloride: 99 mmol/L (ref 96–106)
GFR calc Af Amer: 102 mL/min/{1.73_m2} (ref >59)
GFR, EST NON AFRICAN AMERICAN: 89 mL/min/{1.73_m2} (ref >59)
GLOBULIN, TOTAL: 3.4 g/dL (ref 1.5–4.5)
Glucose: 150 mg/dL — ABNORMAL HIGH (ref 65–99)
POTASSIUM: 4.6 mmol/L (ref 3.5–5.2)
SODIUM: 137 mmol/L (ref 134–144)
TOTAL PROTEIN: 7.2 g/dL (ref 6.0–8.5)

## 2017-05-19 LAB — ENA+DNA/DS+SJORGEN'S
ENA RNP Ab: <0.2 AI (ref 0.0–0.9)
ENA SSA (RO) Ab: <0.2 AI (ref 0.0–0.9)
dsDNA Ab: 2 IU/mL (ref 0–9)

## 2017-05-19 LAB — TSH: TSH: 1.54 u[IU]/mL (ref 0.450–4.500)

## 2017-05-19 LAB — ANA W/REFLEX: Anti Nuclear Antibody(ANA): POSITIVE — AB

## 2017-05-23 ENCOUNTER — Telehealth: Payer: Self-pay | Admitting: *Deleted

## 2017-05-23 NOTE — Telephone Encounter (Signed)
-----   Message from Loletta Specteroger David Gomez, PA-C sent at 05/19/2017  5:24 PM EDT ----- ANA positive but reflex is negative. I will send to rheumatology.

## 2017-05-23 NOTE — Telephone Encounter (Signed)
Patient verified DOB Patient is aware of ANA being positive but the reflex being negative. Patient is aware of referral being made to rheumatologist. [facility]. No further questions at this time.

## 2017-05-23 NOTE — Telephone Encounter (Signed)
-----   Message from Roger David Gomez, PA-C sent at 05/19/2017  5:24 PM EDT ----- ANA positive but reflex is negative. I will send to rheumatology. 

## 2017-05-30 ENCOUNTER — Ambulatory Visit: Payer: Self-pay

## 2017-06-16 ENCOUNTER — Ambulatory Visit (INDEPENDENT_AMBULATORY_CARE_PROVIDER_SITE_OTHER): Payer: Self-pay | Admitting: Physician Assistant

## 2017-06-28 ENCOUNTER — Ambulatory Visit (INDEPENDENT_AMBULATORY_CARE_PROVIDER_SITE_OTHER): Payer: Self-pay | Admitting: Physician Assistant

## 2017-06-28 ENCOUNTER — Encounter (INDEPENDENT_AMBULATORY_CARE_PROVIDER_SITE_OTHER): Payer: Self-pay | Admitting: Physician Assistant

## 2017-06-28 VITALS — BP 115/84 | HR 97 | Temp 98.0°F | Wt 202.6 lb

## 2017-06-28 DIAGNOSIS — F419 Anxiety disorder, unspecified: Secondary | ICD-10-CM

## 2017-06-28 DIAGNOSIS — F329 Major depressive disorder, single episode, unspecified: Secondary | ICD-10-CM

## 2017-06-28 DIAGNOSIS — G894 Chronic pain syndrome: Secondary | ICD-10-CM

## 2017-06-28 DIAGNOSIS — F32A Depression, unspecified: Secondary | ICD-10-CM

## 2017-06-28 MED ORDER — HYDROXYZINE HCL 25 MG PO TABS: 25.0000 mg | ORAL_TABLET | Freq: Every day | ORAL | 1 refills | Status: DC

## 2017-06-28 MED ORDER — DULOXETINE HCL 60 MG PO CPEP: 60.0000 mg | ORAL_CAPSULE | Freq: Every day | ORAL | 3 refills | Status: DC

## 2017-06-28 NOTE — Progress Notes (Signed)
Subjective:  Patient ID: Eileen Bowen, female    DOB: Oct 05, 1976  Age: 41 y.o. MRN: [id number]  CC: f/u depression with anxiety  HPI Eileen Bowen is a 41 y.o. female with a PMH of Eileen Bowen is a 41 y.o. female with a PMH of anxiety, depression, DM2, fibromyalgia, HLD, HTN, migraines, and genital warts presents to f/u on depression, anxiety, and chronic pain. Has not been out of her bed for approximately one month due to chronic pain and depression. Sleeps all day and night. Takes Cymbalta 30 mg qday with relief of neuropathic pain and minimal relief on depression. Still has joint pains. Had positive ANA but negative reflex. Rheumatology referral made but they called patient and told her should would have to pay out of pocket. Same rheumatologist she saw three years ago. Says she would not tell her anything about her prior results because pt would not pay. Missed her orange card and [place] discount program appointments because it is to hard to get out of bed. Does not endorse "running or rambling" thoughts. She has been unemployed for 9 months. Getting ready to move because rent is unaffordable. Son has been paying her rent. Denies suicidal and homicidal ideation/intent.     Outpatient Medications Prior to Visit  Medication Sig Dispense Refill  . DULoxetine (CYMBALTA) 30 MG capsule Take 1 capsule (30 mg total) by mouth daily. 30 capsule 2  . hydrOXYzine (ATARAX/VISTARIL) 25 MG tablet Take 1 tablet (25 mg total) by mouth at bedtime. 30 tablet 1  . metFORMIN (GLUCOPHAGE) 500 MG tablet Take 1 tablet (500 mg total) by mouth 2 (two) times daily with a meal. 180 tablet 1  . naproxen sodium (ANAPROX) 220 MG tablet Take 440 mg by mouth 2 (two) times daily with a meal.     No facility-administered medications prior to visit.      ROS Review of Systems  Constitutional: Negative for chills, fever and malaise/fatigue.  Eyes: Negative for blurred vision.  Respiratory: Negative for shortness  of breath.   Cardiovascular: Negative for chest pain and palpitations.  Gastrointestinal: Negative for abdominal pain and nausea.  Genitourinary: Negative for dysuria and hematuria.  Musculoskeletal: Positive for joint pain and myalgias.  Skin: Negative for rash.  Neurological: Negative for tingling and headaches.  Psychiatric/Behavioral: Positive for depression. The patient is nervous/anxious.     Objective:  BP 115/84 (BP Location: Left Arm, Patient Position: Sitting, Cuff Size: Normal)   Pulse 97   Temp 98 F (36.7 C) (Oral)   Wt 202 lb 9.6 oz (91.9 kg)   LMP 06/26/2017   SpO2 98%   BMI 34.78 kg/m   BP/Weight 06/28/2017 05/18/2017 04/11/2017  Systolic BP 115 128 120  Diastolic BP 84 88 80  Wt. (Lbs) 202.6 205.8 -  BMI 34.78 35.33 -      Physical Exam  Constitutional: She is oriented to person, place, and time.  Well developed, well nourished, NAD, polite  HENT:  Head: Normocephalic and atraumatic.  Eyes: No scleral icterus.  Neck: Normal range of motion. Neck supple. No thyromegaly present.  Cardiovascular: Normal rate, regular rhythm and normal heart sounds.   Pulmonary/Chest: Effort normal and breath sounds normal.  Abdominal: Soft. Bowel sounds are normal. There is no tenderness.  Musculoskeletal: She exhibits no edema.  Neurological: She is alert and oriented to person, place, and time.  Skin: Skin is warm and dry. No rash noted. No erythema. No pallor.  Psychiatric: She has a normal mood and affect. Her  behavior is normal. Thought content normal.  Vitals reviewed.    Assessment & Plan:    1. Chronic pain syndrome - DULoxetine (CYMBALTA) 60 MG capsule; Take 1 capsule (60 mg total) by mouth daily.  Dispense: 30 capsule; Refill: 3 - Positive ANA but negative reflex. Rheumatology referral made but patient unable to afford. Patient said she will work on getting [employer] card and [employer].  2. Depression, unspecified depression type - Ambulatory  referral to Psychiatry - I have also given pt community resources paperwork including mental health resources.  3. Anxiety - Refill hydrOXYzine (ATARAX/VISTARIL) 25 MG tablet; Take 1 tablet (25 mg total) by mouth at bedtime.  Dispense: 30 tablet; Refill: 1 - Ambulatory referral to Psychiatry   Meds ordered this encounter  Medications  . DULoxetine (CYMBALTA) 60 MG capsule    Sig: Take 1 capsule (60 mg total) by mouth daily.    Dispense:  30 capsule    Refill:  3    Order Specific Question:   Supervising Provider    Answer:   Quentin AngstJEGEDE, OLUGBEMIGA E [id number]]  . hydrOXYzine (ATARAX/VISTARIL) 25 MG tablet    Sig: Take 1 tablet (25 mg total) by mouth at bedtime.    Dispense:  30 tablet    Refill:  1    Order Specific Question:   Supervising Provider    Answer:   Quentin AngstJEGEDE, OLUGBEMIGA E [id number]]    Follow-up: Return in about 4 weeks (around 07/26/2017) for depression and chronic pain.   Loletta Specteroger David Gomez PA

## 2017-06-28 NOTE — Patient Instructions (Addendum)
Community Resources  Advocacy/Legal Legal Aid [place]:  1-866-219-5262  /  336-272-0148  Family Justice Center:  336-641-7233  [facility] [place] 24-hr Crisis line:  336-273-7273  Women's Resource Center, [place]:  336-275-6090  Court Watch (custody):  336-275-2346  [place] [facility]:   336-279-9299    Baby & Breastfeeding Car Seat Inspection @ Various [place] Fire Depts.- call 336-373-2177  [place] [facility]  336-832-6860  [facility] 336-878-6712  WIC: 336-641-3663 ([place]);  336-641-7571 ([place])  La Leche League:  1-877-452-5321   Childcare [place] [facility]: 336-369-5097 ([place]) / 336-887-8224 ([place])  - Child Care Resources/ Referrals/ Scholarships  - Head Start/ Early Head Start (call or apply online)  [place] DHHS: [place] Pre-K :  1-800-859-0829 / 336-274-5437   Employment / Job Search [facility] [place]: 336-275-6090 / [address]  [place] [facility] [employer] [facility] (JobLink): 336-373-5922 ([place]) / 336-882-4141 ([place])  [facility] Community Resource/ Career Center: 336-275-9801 / 336-282-7307  [facility] Job & Career Center: 336-373-3764  DHHS Work First: 336-641-3447 ([place]) / 336-641-3447 ([place])  [facility] [place]:  336-676-5871   Financial Assistance [place] [facility]:  336-553-2657  [facility]: 336-235-0368  [employer] (furniture):  336-370-4002  [place] [facility]: 336-373-4264  Low Income Energy Assistance  336-641-3000   Food Assistance DHHS- SNAP/ Food Stamps: 336-641-4588  WIC: [place]- 336-641-3663 ;  [place] 336-641-7571  Little [place] Book- Free Meals  Little Blue Book- Free Food Pantries  During the summer, text "FOOD" to 877877   General Health / Clinics (Adults) Orange Card (for Adults) through [facility]: (336) 895-4900  [facility]:   336-832-8035  [facility]:   336-832-4444  Health Department:  336-641-3245  [facility]:  336-415-3877 / 336-641-2100  [facility] of [place]:   336-373-0678  [facility]:   336-334-4822 x 50251   Housing [place] Housing Coalition:   336-691-9521  [place] Housing Authority:  336-275-8501  Affordable Housing Managemnt:  336-273-0568   Immigrant/ Refugee Center for [place]ians (UNCG):  336-256-1065  [facility]:  336-379-0037  [facility] [facility]:  336-937-4701  [employer] Services:  336-617-0381  African Services Coalition:  336-574-2677   LGBTQ YouthSAFE  www.youthsafe[place].org  PFLAG  336-541-6754 / info@[place].org  The Trevor Project:  1-866-488-7386   Mental Health/ Substance Use [facility]  336-387-6161  [facility]:  336-832-9700 or 1-800-711-2635  [facility] Circle of Care:  336-271-5888  [facility]:  336-294-1349  [facility]:  336-542-2884  [facility] (walk-ins)  336-676-6840 / [address]  [place]:  800-449-1287  Alcoholics Anonymous:  336-854-4278  Narcotics Anonymous:  800-365-1036  Quit Smoking Hotline:  800-QUIT-NOW (800-784-8669)   Parenting Children's Home Society:  800-632-1400  [place]: Education Center & Support Groups:  336-832-6682  YWCA: 336-273-3461  [employer]CG: Bringing Out the Best:  336-334-3120               Thriving at Three (Hispanic families): 336-256-1066  Healthy Start ([facility]):  336-387-6161 x2288  Parents as Teachers:  336-691-0024  [place] [facility]- Learning Together (Immigrants): 336-369-5001   Poison Control 800-222-1222  Sports & Recreation [employer] Open Doors Application: ymcanwnc.org/join/open-doors-financial-assistance/  City of [place] Recreation Centers: http://www.[place].gov/index.aspx?page=3615   Special Needs Family Support Network:  336-832-6507  Autism Society of [place]:   336-333-0197 x1402 or x1412 /  800-785-1035  [facility]CH [place]:  336-334-5773     ARC of [place]:  58712736009163769645  Children's Developmental Service Agency (CDSA):  925-716-2120260 026 5620  [facility] (Care Coordination for Children):  289-850-0695(361) [phone]   Transportation Medicaid Transportation: [phone] [phone] to apply  [place] Transit Authority: 63043172184045662160 (reduced-fare bus ID to Medicaid/ Medicare/ Orange Card)  SCAT Paratransit services: Eligible riders only, call [phone] for application   Tutoring/Mentoring Black Child Development Institute: 747-144-3324  [facility] Brothers/ Big Sisters: 630-767-7235616 888 0758 Manley Mason([place])  [phone] [phone] ([place])  ACES through child's school: (906)403-5817  [employer] Achievers: contact your local Y  SHIELD Mentor Program: 437-261-4496(240)[phone]     Myofascial Pain Syndrome and Fibromyalgia Myofascial pain syndrome and fibromyalgia are both pain disorders. This pain may be felt mainly in your muscles.  Myofascial pain syndrome: ? Always has trigger points or tender points in the muscle that will cause pain when pressed. The pain may come and go. ? Usually affects your neck, upper back, and shoulder areas. The pain often radiates into your arms and hands.  Fibromyalgia: ? Has muscle pains and tenderness that come and go. ? Is often associated with fatigue and sleep disturbances. ? Has trigger points. ? Tends to be long-lasting (chronic), but is not life-threatening.  Fibromyalgia and myofascial pain are not the same. However, they often occur together. If you have both conditions, each can make the other worse. Both are common and can cause enough pain and fatigue to make day-to-day activities difficult. What are the causes? The exact causes of fibromyalgia and myofascial pain are not known. People with certain gene types may be more likely to develop fibromyalgia. Some factors can be triggers for both conditions, such as:  Spine disorders.  Arthritis.  Severe injury (trauma) and other physical stressors.  Being under a lot of stress.  A medical illness.  What are the  signs or symptoms? Fibromyalgia The main symptom of fibromyalgia is widespread pain and tenderness in your muscles. This can vary over time. Pain is sometimes described as stabbing, shooting, or burning. You may have tingling or numbness, too. You may also have sleep problems and fatigue. You may wake up feeling tired and groggy (fibro fog). Other symptoms may include:  Bowel and bladder problems.  Headaches.  Visual problems.  Problems with odors and noises.  Depression or mood changes.  Painful menstrual periods (dysmenorrhea).  Dry skin or eyes.  Myofascial pain syndrome Symptoms of myofascial pain syndrome include:  Tight, ropy bands of muscle.  Uncomfortable sensations in muscular areas, such as: ? Aching. ? Cramping. ? Burning. ? Numbness. ? Tingling. ? Muscle weakness.  Trouble moving certain muscles freely (range of motion).  How is this diagnosed? There are no specific tests to diagnose fibromyalgia or myofascial pain syndrome. Both can be hard to diagnose because their symptoms are common in many other conditions. Your health care provider may suspect one or both of these conditions based on your symptoms and medical history. Your health care provider will also do a physical exam. The key to diagnosing fibromyalgia is having pain, fatigue, and other symptoms for more than three months that cannot be explained by another condition. The key to diagnosing myofascial pain syndrome is finding trigger points in muscles that are tender and cause pain elsewhere in your body (referred pain). How is this treated? Treating fibromyalgia and myofascial pain often requires a team of health care providers. This usually starts with your primary provider and a physical therapist. You may also find it helpful to work with alternative health care providers, such as massage therapists or acupuncturists. Treatment for fibromyalgia  may include medicines. This may include nonsteroidal  anti-inflammatory drugs (NSAIDs), along with other medicines. Treatment for myofascial pain may also include:  NSAIDs.  Cooling and stretching of muscles.  Trigger point injections.  Sound wave (ultrasound) treatments to stimulate muscles.  Follow these instructions at home:  Take medicines only as directed by your health care provider.  Exercise as directed by your health care provider or physical therapist.  Try to avoid stressful situations.  Practice relaxation techniques to control your stress. You may want to try: ? Biofeedback. ? Visual imagery. ? Hypnosis. ? Muscle relaxation. ? Yoga. ? Meditation.  Talk to your health care provider about alternative treatments, such as acupuncture or massage treatment.  Maintain a healthy lifestyle. This includes eating a healthy diet and getting enough sleep.  Consider joining a support group.  Do not do activities that stress or strain your muscles. That includes repetitive motions and heavy lifting. Where to find more information:  National Fibromyalgia Association: www.fmaware.org  Arthritis Foundation: www.arthritis.org  American Chronic Pain Association: GumSearch.nlwww.theacpa.org/condition/myofascial-pain Contact a health care provider if:  You have new symptoms.  Your symptoms get worse.  You have side effects from your medicines.  You have trouble sleeping.  Your condition is causing depression or anxiety. This information is not intended to replace advice given to you by your health care provider. Make sure you discuss any questions you have with your health care provider. Document Released: 11/15/2005 Document Revised: 04/22/2016 Document Reviewed: 08/21/2014 Elsevier Interactive Patient Education  [employer]2018 Elsevier Inc.

## 2017-07-29 ENCOUNTER — Ambulatory Visit (INDEPENDENT_AMBULATORY_CARE_PROVIDER_SITE_OTHER): Payer: Self-pay | Admitting: Physician Assistant

## 2017-08-30 ENCOUNTER — Ambulatory Visit (INDEPENDENT_AMBULATORY_CARE_PROVIDER_SITE_OTHER): Payer: Self-pay | Admitting: Physician Assistant

## 2017-09-01 ENCOUNTER — Ambulatory Visit: Payer: Self-pay | Attending: Physician Assistant

## 2017-09-01 MED FILL — ?DULOXETINE HCL DR 30 MG CA: 30 MG | 30 days supply | Qty: 30 | Fill #1

## 2017-09-01 MED FILL — hydrOXYzine HCL 25 MG TABS: 25 | 30 days supply | Qty: 30 | Fill #1

## 2017-09-01 MED FILL — ?METFORMIN HCL 500MG TABLET: 500 | 30 days supply | Qty: 60 | Fill #1

## 2017-09-06 ENCOUNTER — Encounter (INDEPENDENT_AMBULATORY_CARE_PROVIDER_SITE_OTHER): Payer: Self-pay | Admitting: Physician Assistant

## 2017-09-06 ENCOUNTER — Ambulatory Visit (INDEPENDENT_AMBULATORY_CARE_PROVIDER_SITE_OTHER): Payer: Self-pay | Admitting: Physician Assistant

## 2017-09-06 VITALS — BP 126/88 | HR 80 | Temp 98.3°F | Wt 198.4 lb

## 2017-09-06 DIAGNOSIS — G43909 Migraine, unspecified, not intractable, without status migrainosus: Secondary | ICD-10-CM

## 2017-09-06 DIAGNOSIS — E118 Type 2 diabetes mellitus with unspecified complications: Secondary | ICD-10-CM

## 2017-09-06 DIAGNOSIS — H538 Other visual disturbances: Secondary | ICD-10-CM

## 2017-09-06 LAB — POCT GLYCOSYLATED HEMOGLOBIN (HGB A1C): HEMOGLOBIN A1C: 6.6

## 2017-09-06 MED ORDER — SUMATRIPTAN SUCCINATE 25 MG PO TABS: 25.0000 mg | ORAL_TABLET | Freq: Every day | ORAL | 0 refills | Status: DC

## 2017-09-06 NOTE — Progress Notes (Signed)
Subjective:  Patient ID: Eileen Bowen, female    DOB: 1976-03-15  Age: 41 y.o. MRN: [id number]  CC: f/u depression and chronic pain. Migraine  HPI  Eileen Bowen is a 41 y.o. female with a PMH of Raylei Smithis a 41 y.o.femalewith a PMH of anxiety, depression, DM2, fibromyalgia, HLD, HTN, migraines, and genital warts presents to f/u on depression and chronic pain. Cymbalta was increased to 60 mg which patient began to take yesterday.     Went to [employer] approximately 10 days ago. Diagnosed with bipolar disease Was started on Latuda and Lamictal and thinks the medications are somewhat helpful. Did not report duloxetine use because "no one asked me".    Complains of frontal headaches and visual blurring in the periphery of eyes. Had three episodes of visual blurring that lasted 45 minutes each time. Visual blurring began before she began her Latuda and Lamictal. Headaches have waxed and waned since 2006.     Finished her orange card application last week and is waiting on approval.     Outpatient Medications Prior to Visit  Medication Sig Dispense Refill  . DULoxetine (CYMBALTA) 60 MG capsule Take 1 capsule (60 mg total) by mouth daily. 30 capsule 3  . hydrOXYzine (ATARAX/VISTARIL) 25 MG tablet Take 1 tablet (25 mg total) by mouth at bedtime. 30 tablet 1  . metFORMIN (GLUCOPHAGE) 500 MG tablet Take 1 tablet (500 mg total) by mouth 2 (two) times daily with a meal. 180 tablet 1  . naproxen sodium (ANAPROX) 220 MG tablet Take 440 mg by mouth 2 (two) times daily with a meal.     No facility-administered medications prior to visit.      ROS Review of Systems  Constitutional: Negative for chills, fever and malaise/fatigue.  Eyes: Positive for blurred vision.  Respiratory: Negative for shortness of breath.   Cardiovascular: Negative for chest pain and palpitations.  Gastrointestinal: Negative for abdominal pain and nausea.  Genitourinary: Negative for dysuria and hematuria.   Musculoskeletal: Negative for joint pain and myalgias.  Skin: Negative for rash.  Neurological: Positive for headaches. Negative for tingling.  Psychiatric/Behavioral: Negative for depression. The patient is not nervous/anxious.     Objective:  BP 126/88 (BP Location: Left Arm, Patient Position: Sitting, Cuff Size: Large)   Pulse 80   Temp 98.3 F (36.8 C) (Oral)   Wt 198 lb 6.4 oz (90 kg)   LMP 08/21/2017 (Exact Date)   SpO2 97%   BMI 34.06 kg/m   BP/Weight 09/06/2017 06/28/2017 05/18/2017  Systolic BP 126 115 128  Diastolic BP 88 84 88  Wt. (Lbs) 198.4 202.6 205.8  BMI 34.06 34.78 35.33      Physical Exam  Constitutional: She is oriented to person, place, and time.  Well developed, overweight, NAD, polite  HENT:  Head: Normocephalic and atraumatic.  Eyes: Pupils are equal, round, and reactive to light. Conjunctivae are normal. No scleral icterus.  Neck: Normal range of motion. Neck supple. No thyromegaly present.  Cardiovascular: Normal rate, regular rhythm and normal heart sounds.   No carotid bruits bilaterally  Pulmonary/Chest: Effort normal and breath sounds normal.  Musculoskeletal: She exhibits no edema.  Neurological: She is alert and oriented to person, place, and time. No cranial nerve deficit. Coordination normal.  Skin: Skin is warm and dry. No rash noted. No erythema. No pallor.  Psychiatric: She has a normal mood and affect. Her behavior is normal. Thought content normal.  Vitals reviewed.    Assessment & Plan:  1. Migraine without status migrainosus, not intractable, unspecified migraine type - Begin SUMAtriptan (IMITREX) 25 MG tablet; Take 1 tablet (25 mg total) by mouth daily. May take one more tablet two hours after the first. No more than two tablets per day.  Dispense: 30 tablet; Refill: 0  2. Blurring of visual imagae - Likely from migraine, less likely from lamictal and duloxetine use. I have advised patient to call her psychiatrist and  inform him/her of her duloxetine use. I have asked patient to suspend duloxetine until she gets approval from psychiatrist.  3. Type 2 diabetes mellitus with complication, without long-term current use of insulin (HCC0 - HgB A1c 6.6% in clinic today - return in 4 weeks for diabetic foot exam, ophthalmology referral, and vaccines.  Meds ordered this encounter  Medications  . SUMAtriptan (IMITREX) 25 MG tablet    Sig: Take 1 tablet (25 mg total) by mouth daily. May take one more tablet two hours after the first. No more than two tablets per day.    Dispense:  30 tablet    Refill:  0    Order Specific Question:   Supervising Provider    Answer:   Quentin Angst [id number]    Follow-up: Return in about 2 weeks (around 09/20/2017) for vaccines, diabetic f/u.Marland Kitchen   Loletta Specter PA

## 2017-09-06 NOTE — Progress Notes (Signed)
Pt complains of blurred vision on the outer areas of her eyes

## 2017-09-06 NOTE — Patient Instructions (Signed)
Migraine Headache A migraine headache is an intense, throbbing pain on one side or both sides of the head. Migraines may also cause other symptoms, such as nausea, vomiting, and sensitivity to light and noise. What are the causes? Doing or taking certain things may also trigger migraines, such as:  Alcohol.  Smoking.  Medicines, such as: ? Medicine used to treat chest pain (nitroglycerine). ? Birth control pills. ? Estrogen pills. ? Certain blood pressure medicines.  Aged cheeses, chocolate, or caffeine.  Foods or drinks that contain nitrates, glutamate, aspartame, or tyramine.  Physical activity.  Other things that may trigger a migraine include:  Menstruation.  Pregnancy.  Hunger.  Stress, lack of sleep, too much sleep, or fatigue.  Weather changes.  What increases the risk? The following factors may make you more likely to experience migraine headaches:  Age. Risk increases with age.  Family history of migraine headaches.  Being Caucasian.  Depression and anxiety.  Obesity.  Being a woman.  Having a hole in the heart (patent foramen ovale) or other heart problems.  What are the signs or symptoms? The main symptom of this condition is pulsating or throbbing pain. Pain may:  Happen in any area of the head, such as on one side or both sides.  Interfere with daily activities.  Get worse with physical activity.  Get worse with exposure to bright lights or loud noises.  Other symptoms may include:  Nausea.  Vomiting.  Dizziness.  General sensitivity to bright lights, loud noises, or smells.  Before you get a migraine, you may get warning signs that a migraine is developing (aura). An aura may include:  Seeing flashing lights or having blind spots.  Seeing bright spots, halos, or zigzag lines.  Having tunnel vision or blurred vision.  Having numbness or a tingling feeling.  Having trouble talking.  Having muscle weakness.  How is this  diagnosed? A migraine headache can be diagnosed based on:  Your symptoms.  A physical exam.  Tests, such as CT scan or MRI of the head. These imaging tests can help rule out other causes of headaches.  Taking fluid from the spine (lumbar puncture) and analyzing it (cerebrospinal fluid analysis, or CSF analysis).  How is this treated? A migraine headache is usually treated with medicines that:  Relieve pain.  Relieve nausea.  Prevent migraines from coming back.  Treatment may also include:  Acupuncture.  Lifestyle changes like avoiding foods that trigger migraines.  Follow these instructions at home: Medicines  Take over-the-counter and prescription medicines only as told by your health care provider.  Do not drive or use heavy machinery while taking prescription pain medicine.  To prevent or treat constipation while you are taking prescription pain medicine, your health care provider may recommend that you: ? Drink enough fluid to keep your urine clear or pale yellow. ? Take over-the-counter or prescription medicines. ? Eat foods that are high in fiber, such as fresh fruits and vegetables, whole grains, and beans. ? Limit foods that are high in fat and processed sugars, such as fried and sweet foods. Lifestyle  Avoid alcohol use.  Do not use any products that contain nicotine or tobacco, such as cigarettes and e-cigarettes. If you need help quitting, ask your health care provider.  Get at least 8 hours of sleep every night.  Limit your stress. General instructions   Keep a journal to find out what may trigger your migraine headaches. For example, write down: ? What you eat and   drink. ? How much sleep you get. ? Any change to your diet or medicines.  If you have a migraine: ? Avoid things that make your symptoms worse, such as bright lights. ? It may help to lie down in a dark, quiet room. ? Do not drive or use heavy machinery. ? Ask your health care provider  what activities are safe for you while you are experiencing symptoms.  Keep all follow-up visits as told by your health care provider. This is important. Contact a health care provider if:  You develop symptoms that are different or more severe than your usual migraine symptoms. Get help right away if:  Your migraine becomes severe.  You have a fever.  You have a stiff neck.  You have vision loss.  Your muscles feel weak or like you cannot control them.  You start to lose your balance often.  You develop trouble walking.  You faint. This information is not intended to replace advice given to you by your health care provider. Make sure you discuss any questions you have with your health care provider. Document Released: 11/15/2005 Document Revised: 06/04/2016 Document Reviewed: 05/03/2016 Elsevier Interactive Patient Education  2017 Elsevier Inc.   

## 2017-09-20 ENCOUNTER — Ambulatory Visit (INDEPENDENT_AMBULATORY_CARE_PROVIDER_SITE_OTHER): Payer: Self-pay | Admitting: Physician Assistant

## 2017-10-05 ENCOUNTER — Encounter (INDEPENDENT_AMBULATORY_CARE_PROVIDER_SITE_OTHER): Payer: Self-pay | Admitting: Physician Assistant

## 2017-10-05 ENCOUNTER — Ambulatory Visit (INDEPENDENT_AMBULATORY_CARE_PROVIDER_SITE_OTHER): Payer: Self-pay | Admitting: Physician Assistant

## 2017-10-05 VITALS — BP 121/86 | HR 96 | Temp 97.9°F | Wt 195.8 lb

## 2017-10-05 DIAGNOSIS — L989 Disorder of the skin and subcutaneous tissue, unspecified: Secondary | ICD-10-CM

## 2017-10-05 DIAGNOSIS — I7300 Raynaud's syndrome without gangrene: Secondary | ICD-10-CM | POA: Insufficient documentation

## 2017-10-05 DIAGNOSIS — W57XXXA Bitten or stung by nonvenomous insect and other nonvenomous arthropods, initial encounter: Secondary | ICD-10-CM

## 2017-10-05 MED ORDER — DOXYCYCLINE HYCLATE 100 MG PO TABS: 100.0000 mg | ORAL_TABLET | Freq: Two times a day (BID) | ORAL | 0 refills | Status: AC

## 2017-10-05 MED ORDER — HYDROXYZINE HCL 25 MG PO TABS: 25.0000 mg | ORAL_TABLET | Freq: Every day | ORAL | 0 refills | Status: DC

## 2017-10-05 MED FILL — hydrOXYzine HCL 25 MG TABS: 25 | 7 days supply | Qty: 7 | Fill #0

## 2017-10-05 MED FILL — DOXYCYCLINE 100 MG TABLET: 100 | 7 days supply | Qty: 14 | Fill #0

## 2017-10-05 NOTE — Patient Instructions (Signed)
Doxycycline tablets or capsules What is this medicine? DOXYCYCLINE (dox i SYE kleen) is a tetracycline antibiotic. It kills certain bacteria or stops their growth. It is used to treat many kinds of infections, like dental, skin, respiratory, and urinary tract infections. It also treats acne, Lyme disease, malaria, and certain sexually transmitted infections. This medicine may be used for other purposes; ask your health care provider or pharmacist if you have questions. COMMON BRAND NAME(S): Acticlate, Adoxa, Adoxa CK, Adoxa Pak, Adoxa TT, Alodox, Avidoxy, Doxal, Mondoxyne NL, Monodox, Morgidox 1x, Morgidox 1x Kit, Morgidox 2x, Morgidox 2x Kit, NutriDox, Ocudox, TARGADOX, Vibra-Tabs, Vibramycin What should I tell my health care provider before I take this medicine? They need to know if you have any of these conditions: -liver disease -long exposure to sunlight like working outdoors -stomach problems like colitis -an unusual or allergic reaction to doxycycline, tetracycline antibiotics, other medicines, foods, dyes, or preservatives -pregnant or trying to get pregnant -breast-feeding How should I use this medicine? Take this medicine by mouth with a full glass of water. Follow the directions on the prescription label. It is best to take this medicine without food, but if it upsets your stomach take it with food. Take your medicine at regular intervals. Do not take your medicine more often than directed. Take all of your medicine as directed even if you think you are better. Do not skip doses or stop your medicine early. Talk to your pediatrician regarding the use of this medicine in children. While this drug may be prescribed for selected conditions, precautions do apply. Overdosage: If you think you have taken too much of this medicine contact a poison control center or emergency room at once. NOTE: This medicine is only for you. Do not share this medicine with others. What if I miss a dose? If you  miss a dose, take it as soon as you can. If it is almost time for your next dose, take only that dose. Do not take double or extra doses. What may interact with this medicine? -antacids -barbiturates -birth control pills -bismuth subsalicylate -carbamazepine -methoxyflurane -other antibiotics -phenytoin -vitamins that contain iron -warfarin This list may not describe all possible interactions. Give your health care provider a list of all the medicines, herbs, non-prescription drugs, or dietary supplements you use. Also tell them if you smoke, drink alcohol, or use illegal drugs. Some items may interact with your medicine. What should I watch for while using this medicine? Tell your doctor or health care professional if your symptoms do not improve. Do not treat diarrhea with over the counter products. Contact your doctor if you have diarrhea that lasts more than 2 days or if it is severe and watery. Do not take this medicine just before going to bed. It may not dissolve properly when you lay down and can cause pain in your throat. Drink plenty of fluids while taking this medicine to also help reduce irritation in your throat. This medicine can make you more sensitive to the sun. Keep out of the sun. If you cannot avoid being in the sun, wear protective clothing and use sunscreen. Do not use sun lamps or tanning beds/booths. Birth control pills may not work properly while you are taking this medicine. Talk to your doctor about using an extra method of birth control. If you are being treated for a sexually transmitted infection, avoid sexual contact until you have finished your treatment. Your sexual partner may also need treatment. Avoid antacids, aluminum, calcium, magnesium, and  iron products for 4 hours before and 2 hours after taking a dose of this medicine. If you are using this medicine to prevent malaria, you should still protect yourself from contact with mosquitos. Stay in screened-in  areas, use mosquito nets, keep your body covered, and use an insect repellent. What side effects may I notice from receiving this medicine? Side effects that you should report to your doctor or health care professional as soon as possible: -allergic reactions like skin rash, itching or hives, swelling of the face, lips, or tongue -difficulty breathing -fever -itching in the rectal or genital area -pain on swallowing -redness, blistering, peeling or loosening of the skin, including inside the mouth -severe stomach pain or cramps -unusual bleeding or bruising -unusually weak or tired -yellowing of the eyes or skin Side effects that usually do not require medical attention (report to your doctor or health care professional if they continue or are bothersome): -diarrhea -loss of appetite -nausea, vomiting This list may not describe all possible side effects. Call your doctor for medical advice about side effects. You may report side effects to FDA at 1-800-FDA-1088. Where should I keep my medicine? Keep out of the reach of children. Store at room temperature, below 30 degrees C (86 degrees F). Protect from light. Keep container tightly closed. Throw away any unused medicine after the expiration date. Taking this medicine after the expiration date can make you seriously ill. NOTE: This sheet is a summary. It may not cover all possible information. If you have questions about this medicine, talk to your doctor, pharmacist, or health care provider.  2018 Elsevier/Gold Standard (2015-12-17 17:11:22)

## 2017-10-05 NOTE — Progress Notes (Signed)
Subjective:  Patient ID: Eileen Bowen, female    DOB: [birth year]  Age: 41 y.o. MRN: [id number]  CC: insect bite  HPI  Eileen Smithis a 41 y.o.femalewith a PMH of anxiety, depression, DM2, fibromyalgia, Raynaud's, HLD, HTN, migraines, and genital warts presents with a one day hx of skin lesion. Unknown what may have caused lesion. Began as a small pruritic lesion and progressed rapidly into a painful, erythematous, and circular lesion. No suppuration, bleeding, streaking, fever, chills, nausea, vomiting.      Outpatient Medications Prior to Visit  Medication Sig Dispense Refill  . gabapentin (NEURONTIN) 100 MG capsule Take 100 mg 3 (three) times daily by mouth.    . lamoTRIgine (LAMICTAL) 100 MG tablet Take 100 mg by mouth 2 (two) times daily.    Marland Kitchen. lurasidone (LATUDA) 40 MG TABS tablet Take 60 mg once by mouth.     . metFORMIN (GLUCOPHAGE) 500 MG tablet Take 1 tablet (500 mg total) by mouth 2 (two) times daily with a meal. 180 tablet 1  . mirtazapine (REMERON) 15 MG tablet Take 15 mg at bedtime by mouth.    . naproxen sodium (ANAPROX) 220 MG tablet Take 440 mg by mouth 2 (two) times daily with a meal.    . DULoxetine (CYMBALTA) 60 MG capsule Take 1 capsule (60 mg total) by mouth daily. (Patient not taking: Reported on 10/05/2017) 30 capsule 3  . hydrOXYzine (ATARAX/VISTARIL) 25 MG tablet Take 1 tablet (25 mg total) by mouth at bedtime. (Patient not taking: Reported on 10/05/2017) 30 tablet 1  . SUMAtriptan (IMITREX) 25 MG tablet Take 1 tablet (25 mg total) by mouth daily. May take one more tablet two hours after the first. No more than two tablets per day. (Patient not taking: Reported on 10/05/2017) 30 tablet 0   No facility-administered medications prior to visit.      ROS Review of Systems  Constitutional: Negative for chills, fever and malaise/fatigue.  Eyes: Negative for blurred vision.  Respiratory: Negative for shortness of breath.   Cardiovascular: Negative for chest  pain and palpitations.  Gastrointestinal: Negative for abdominal pain and nausea.  Genitourinary: Negative for dysuria and hematuria.  Musculoskeletal: Negative for joint pain and myalgias.  Skin: Positive for rash.  Neurological: Negative for tingling and headaches.  Psychiatric/Behavioral: Negative for depression. The patient is not nervous/anxious.     Objective:  BP 121/86 (BP Location: Left Arm, Patient Position: Sitting, Cuff Size: Large)   Pulse 96   Temp 97.9 F (36.6 C) (Oral)   Wt 195 lb 12.8 oz (88.8 kg)   SpO2 96%   BMI 33.61 kg/m   BP/Weight 10/05/2017 09/06/2017 06/28/2017  Systolic BP 121 126 115  Diastolic BP 86 88 84  Wt. (Lbs) 195.8 198.4 202.6  BMI 33.61 34.06 34.78      Physical Exam  Constitutional: She is oriented to person, place, and time.  Well developed, overweight, NAD, polite  HENT:  Head: Normocephalic and atraumatic.  Eyes: No scleral icterus.  Neck: Normal range of motion. Neck supple. No thyromegaly present.  Cardiovascular: Normal rate, regular rhythm and normal heart sounds.  Pulmonary/Chest: Effort normal and breath sounds normal. No respiratory distress. She has no wheezes.  Abdominal: Soft. There is no tenderness.  Musculoskeletal: She exhibits no edema.  Neurological: She is alert and oriented to person, place, and time. No cranial nerve deficit. Coordination normal.  Skin: Skin is warm and dry.  Left lateral aspect of calf with 60 mm, circular, erythematous lesion with  a central area of deeper erythema and mild induration. Lesion with increased warmth. No suppuration, bleeding, streaking, or necrosis. LLE with full aROM.  Psychiatric: She has a normal mood and affect. Her behavior is normal. Thought content normal.  Vitals reviewed.    Assessment & Plan:    1. Insect bite, initial encounter - Begin Doxycycline - Begin hydroxyzine - Counseled on red flag symptoms and to call a provider with concerns.   Meds ordered this  encounter  Medications  . hydrOXYzine (ATARAX/VISTARIL) 25 MG tablet    Sig: Take 1 tablet (25 mg total) at bedtime by mouth.    Dispense:  7 tablet    Refill:  0    Order Specific Question:   Supervising Provider    Answer:   Quentin AngstJEGEDE, OLUGBEMIGA E [id number]]  . doxycycline (VIBRA-TABS) 100 MG tablet    Sig: Take 1 tablet (100 mg total) 2 (two) times daily for 7 days by mouth.    Dispense:  14 tablet    Refill:  0    Order Specific Question:   Supervising Provider    Answer:   Quentin AngstJEGEDE, OLUGBEMIGA E [id number]    Follow-up: Return if symptoms worsen or fail to improve.   Loletta Specteroger David Shakir Petrosino PA

## 2017-10-24 MED FILL — ?METFORMIN HCL 500MG TABLET: 500 | 30 days supply | Qty: 60 | Fill #2

## 2017-10-27 ENCOUNTER — Encounter (HOSPITAL_COMMUNITY): Payer: Self-pay | Admitting: Emergency Medicine

## 2017-10-27 ENCOUNTER — Emergency Department (HOSPITAL_COMMUNITY): Payer: Medicaid Other

## 2017-10-27 ENCOUNTER — Emergency Department (HOSPITAL_COMMUNITY)
Admission: EM | Admit: 2017-10-27 | Discharge: 2017-10-27 | Disposition: A | Discharge status: Home / Self Care | Payer: Medicaid Other | Attending: Emergency Medicine | Admitting: Emergency Medicine

## 2017-10-27 DIAGNOSIS — I10 Essential (primary) hypertension: Secondary | ICD-10-CM | POA: Insufficient documentation

## 2017-10-27 DIAGNOSIS — M797 Fibromyalgia: Secondary | ICD-10-CM | POA: Insufficient documentation

## 2017-10-27 DIAGNOSIS — E119 Type 2 diabetes mellitus without complications: Secondary | ICD-10-CM | POA: Insufficient documentation

## 2017-10-27 DIAGNOSIS — M546 Pain in thoracic spine: Secondary | ICD-10-CM

## 2017-10-27 DIAGNOSIS — F17210 Nicotine dependence, cigarettes, uncomplicated: Secondary | ICD-10-CM | POA: Insufficient documentation

## 2017-10-27 DIAGNOSIS — Z79899 Other long term (current) drug therapy: Secondary | ICD-10-CM | POA: Insufficient documentation

## 2017-10-27 DIAGNOSIS — Z7984 Long term (current) use of oral hypoglycemic drugs: Secondary | ICD-10-CM | POA: Insufficient documentation

## 2017-10-27 DIAGNOSIS — M549 Dorsalgia, unspecified: Secondary | ICD-10-CM | POA: Insufficient documentation

## 2017-10-27 MED ORDER — DIAZEPAM 2 MG PO TABS: 2.0000 mg | ORAL_TABLET | Freq: Two times a day (BID) | ORAL | 0 refills | Status: DC

## 2017-10-27 MED ORDER — NAPROXEN 500 MG PO TABS
500.0000 mg | ORAL_TABLET | Freq: Once | ORAL | Status: AC
  Administered 2017-10-27: 500 mg via ORAL
  Filled 2017-10-27: qty 1

## 2017-10-27 MED ORDER — DIAZEPAM 2 MG PO TABS
2.0000 mg | ORAL_TABLET | Freq: Once | ORAL | Status: AC
  Administered 2017-10-27: 2 mg via ORAL
  Filled 2017-10-27: qty 1

## 2017-10-27 MED ORDER — NAPROXEN 375 MG PO TABS: 375.0000 mg | ORAL_TABLET | Freq: Two times a day (BID) | ORAL | 0 refills | Status: DC

## 2017-10-27 NOTE — ED Triage Notes (Signed)
Per pt, states lower back pain radiating down both legs-no injury-states she cant bend over or lift arms over head

## 2017-10-27 NOTE — ED Provider Notes (Signed)
[facility]-EMERGENCY DEPT Provider Note   CSN: [id number] Arrival date & time: 10/27/17  1419     History   Chief Complaint Chief Complaint  Patient presents with  . Back Pain    HPI Eileen Bowen is a 41 y.o. female.  HPI 41 year old African-American female past medical history significant for hypertension, migraines, diabetes, depression, anxiety, fibromyalgia presents to the emergency department today with complaints of thoracic back spasms and pain.  The patient states that approximately 2 days ago she went out in the cold and started experiencing some back pain..  This was due to her fibromyalgia.  Patient states that she does have joint pain and fibromyalgia baseline.  She is supposed to be following up with a rheumatologist.  Patient states she does have history of back problems and this feels similar in the past.  States that her back is spasming in different locations and the pain "jumps around".  Patient states the pain is worse with bending and lifting her arms overhead.  Patient denies any known injuries.  Patient has not taking today for the pain prior to arrival.  Patient denies any associated shortness of breath, chest pain, abdominal pain, nausea, emesis, urinary symptoms, rashes, headaches, vision changes, lightheadedness, dizziness, neck pain, saddle paresthesias, lower extremity paresthesias, urinary retention, loss of bowel or bladder, ivdu, or cancer.  She denies any history of DVT/PE, prolonged immobilization, recent hospitalization/surgeries, unilateral leg swelling, OCP use, tobacco use. Past Medical History:  Diagnosis Date  . Anxiety   . Depression   . Diabetes mellitus   . Dysmenorrhea   . Fibromyalgia   . Genital warts   . Hypercholesteremia   . Hypertension   . Migraine     Patient Active Problem List   Diagnosis Date Noted  . Raynaud's phenomenon 10/05/2017  . GAD (generalized anxiety disorder) 04/11/2017  . 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 Diabetes mellitus (HCC)   . Hypertension   . Hypercholesteremia   . Fibromyalgia   . Migraine   . Anxiety   . Depression     Past Surgical History:  Procedure Laterality Date  . CESAREAN SECTION    . CHOLECYSTECTOMY  09/1999  . COLPOSCOPY    . TUBAL LIGATION      OB History    Gravida Para Term Preterm AB Living   2 2 2     2    SAB TAB Ectopic Multiple Live Births           2       Home Medications    Prior to Admission medications   Medication Sig Start Date End Date Taking? Authorizing Provider  gabapentin (NEURONTIN) 100 MG capsule Take 100 mg 3 (three) times daily by mouth.    [provider]  hydrOXYzine (ATARAX/VISTARIL) 25 MG tablet Take 1 tablet (25 mg total) at bedtime by mouth. 10/05/17   Loletta SpecterGomez, Roger David, PA-C  lamoTRIgine (LAMICTAL) 100 MG tablet Take 100 mg by mouth 2 (two) times daily.    [provider]  lurasidone (LATUDA) 40 MG TABS tablet Take 60 mg once by mouth.     [provider]  metFORMIN (GLUCOPHAGE) 500 MG tablet Take 1 tablet (500 mg total) by mouth 2 (two) times daily with a meal. 05/18/17   Loletta SpecterGomez, Roger David, PA-C  mirtazapine (REMERON) 15 MG tablet Take 15 mg at bedtime by mouth.    [provider]  naproxen sodium (ANAPROX) 220 MG  tablet Take 440 mg by mouth 2 (two) times daily with a meal.    [provider]    Family History Family History  Problem Relation Age of Onset  . Diabetes Mother   . Fibromyalgia Sister   . Hypertension Maternal Aunt   . Thyroid disease Maternal Grandmother   . Asthma Son     Social History Social History   Tobacco Use  . Smoking status: Current Every Day Smoker    Packs/day: 0.50    Years: 21.00    Pack years: 10.50    Types: Cigarettes  . Smokeless tobacco: Never Used  Substance Use Topics  . Alcohol use: Yes    Alcohol/week: 0.6 oz    Types: 1 Standard drinks or equivalent per  week  . Drug use: No     Allergies   Cashew nut oil   Review of Systems Review of Systems  Constitutional: Negative for chills and fever.  HENT: Negative for congestion.   Respiratory: Negative for shortness of breath.   Cardiovascular: Negative for chest pain, palpitations and leg swelling.  Gastrointestinal: Negative for abdominal pain, nausea and vomiting.  Genitourinary: Negative for dysuria, flank pain, frequency, hematuria and urgency.  Musculoskeletal: Positive for arthralgias and back pain. Negative for gait problem, joint swelling, myalgias, neck pain and neck stiffness.  Skin: Negative for rash.  Neurological: Negative for weakness and numbness.     Physical Exam Updated Vital Signs BP (!) 135/106   Pulse 78   Temp 98.1 F (36.7 C) (Oral)   Resp 16   LMP 10/16/2017   SpO2 98%   Physical Exam  Constitutional: She is oriented to person, place, and time. She appears well-developed and well-nourished. No distress.  HENT:  Head: Normocephalic and atraumatic.  Eyes: Right eye exhibits no discharge. Left eye exhibits no discharge. No scleral icterus.  Neck: Normal range of motion. Neck supple.  No c spine midline tenderness. No paraspinal tenderness. No deformities or step offs noted. Full ROM. Supple. No nuchal rigidity.    Cardiovascular: Normal rate, regular rhythm and normal heart sounds.  Pulmonary/Chest: Effort normal and breath sounds normal. No stridor. No respiratory distress. She has no wheezes. She has no rales. She exhibits no tenderness.  Abdominal: Soft.  Musculoskeletal: Normal range of motion.       Arms: Patient with pain with palpation of the thoracic paraspinal musculature.  No obvious rash, erythema, ecchymosis, deformity, step-offs noted.  Patient has no midline L-spine or C-spine tenderness.  Full range of motion.  No lower extremity edema. No calf tenderness.   Neurological: She is alert and oriented to person, place, and time.  Strength 5  out of 5 in lower extremities.  Sensation intact.  Skin: Skin is warm and dry. Capillary refill takes less than 2 seconds. No rash noted. No pallor.  No obvious rash noted.  Psychiatric: Her behavior is normal. Judgment and thought content normal.  Nursing note and vitals reviewed.    ED Treatments / Results  Labs (all labs ordered are listed, but only abnormal results are displayed) Labs Reviewed - No data to display  EKG  EKG Interpretation None       Radiology Dg Chest 2 View  Result Date: 10/27/2017 CLINICAL DATA:  41 y/o F; thoracic back spasm with movement for 1 day. EXAM: CHEST  2 VIEW COMPARISON:  11/12/2015 chest radiograph FINDINGS: The heart size and mediastinal contours are within normal limits. Both lungs are clear. The visualized skeletal structures are  unremarkable. IMPRESSION: No active cardiopulmonary disease. Electronically Signed   By: Mitzi Hansen M.D.   On: 10/27/2017 20:20    Procedures Procedures (including critical care time)  Medications Ordered in ED Medications  diazepam (VALIUM) tablet 2 mg (not administered)  naproxen (NAPROSYN) tablet 500 mg (not administered)     Initial Impression / Assessment and Plan / ED Course  I have reviewed the triage vital signs and the nursing notes.  Pertinent labs & imaging results that were available during my care of the patient were reviewed by me and considered in my medical decision making (see chart for details).     Patient presents to the ED for evaluation of 2 days of thoracic back pain.  Patient states the pain jumps around and feels like muscle spasms.  Patient has a history of fibromyalgia, arthritis and has been to be following up with a rheumatologist.  Patient states the pain is worse with palpation and range of motion with bending.  Denies any associated chest pain, shortness of breath.  Patient has no DVT/PE risk factors.  Patient is PERC negative.  Clinical presentation and history is  not seem consistent with PE.  Pain is very reproducible on palpation.  Patient has no focal neuro deficits.  X-ray of the thoracic spine reveals no acute abnormalities.  Clinical presentation is not consistent with dissection, PE, ACS, pneumonia.  Seems very much musculoskeletal in nature.  Patient has no rash at this time.  Discussed that she is to watch out for a rash that could be concerning for shingles.  However have low suspicion given that patient states the pain jumps around on all sides of her back.  She has no red flag symptoms that be concerning for cauda equina or low back injury.  Patient encouraged to try symptomatic treatment at home with muscle relaxers and NSAIDs.  Encouraged follow-up with PCP.  Pt is hemodynamically stable, in NAD, & able to ambulate in the ED. Evaluation does not show pathology that would require ongoing emergent intervention or inpatient treatment. I explained the diagnosis to the patient. Pain has been managed & has no complaints prior to dc. Pt is comfortable with above plan and is stable for discharge at this time. All questions were answered prior to disposition. Strict return precautions for f/u to the ED were discussed. Encouraged follow up with PCP.   Final Clinical Impressions(s) / ED Diagnoses   Final diagnoses:  Acute bilateral thoracic back pain    ED Discharge Orders        Ordered    diazepam (VALIUM) 2 MG tablet  2 times daily     10/27/17 2047    naproxen (NAPROSYN) 375 MG tablet  2 times daily     10/27/17 2047       Wallace Keller 10/27/17 2049    Bethann Berkshire, MD 10/28/17 1500

## 2017-10-27 NOTE — ED Notes (Signed)
Patient reports she has a ride home. 

## 2017-10-27 NOTE — Discharge Instructions (Signed)
Your x-rays showed no acute abnormalities.  This seems more musculoskeletal related.  Please watch out for rash to be consistent with shingles.  Apply heat to the affected area.  Warm soaks and Epsom salt. Please the the valium for muscle relaxation. This medication will make you drowsy so avoid situation that could place you in danger.  Only take this at night.  This medication will make you drowsy do not take while you are working.  Please take the Naproxen as prescribed for pain. Do not take any additional NSAIDs including Motrin, Aleve, Ibuprofen, Advil.  Do not take any additional tonight as that you have been given one in the ED.  May take Tylenol.  Perform the back stretches as given.  Follow up with her primary care doctor if symptoms not improving.  Return to the ED with any worsening symptoms including shortness of breath, fevers, chest pain

## 2018-01-13 DIAGNOSIS — I10 Essential (primary) hypertension: Secondary | ICD-10-CM

## 2020-08-04 ENCOUNTER — Encounter (HOSPITAL_COMMUNITY): Payer: Self-pay

## 2020-08-04 ENCOUNTER — Emergency Department (HOSPITAL_COMMUNITY): Payer: BC Managed Care – PPO

## 2020-08-04 ENCOUNTER — Emergency Department (HOSPITAL_COMMUNITY)
Admission: EM | Admit: 2020-08-04 | Discharge: 2020-08-04 | Disposition: A | Discharge status: Home / Self Care | Payer: BC Managed Care – PPO | Attending: Emergency Medicine | Admitting: Emergency Medicine

## 2020-08-04 ENCOUNTER — Other Ambulatory Visit: Payer: Self-pay

## 2020-08-04 DIAGNOSIS — Z79899 Other long term (current) drug therapy: Secondary | ICD-10-CM | POA: Diagnosis not present

## 2020-08-04 DIAGNOSIS — Y9389 Activity, other specified: Secondary | ICD-10-CM | POA: Insufficient documentation

## 2020-08-04 DIAGNOSIS — E119 Type 2 diabetes mellitus without complications: Secondary | ICD-10-CM | POA: Diagnosis not present

## 2020-08-04 DIAGNOSIS — S50312A Abrasion of left elbow, initial encounter: Secondary | ICD-10-CM | POA: Insufficient documentation

## 2020-08-04 DIAGNOSIS — M25562 Pain in left knee: Secondary | ICD-10-CM | POA: Diagnosis not present

## 2020-08-04 DIAGNOSIS — T07XXXA Unspecified multiple injuries, initial encounter: Secondary | ICD-10-CM

## 2020-08-04 DIAGNOSIS — S59902A Unspecified injury of left elbow, initial encounter: Secondary | ICD-10-CM | POA: Diagnosis not present

## 2020-08-04 DIAGNOSIS — I10 Essential (primary) hypertension: Secondary | ICD-10-CM | POA: Insufficient documentation

## 2020-08-04 DIAGNOSIS — M25519 Pain in unspecified shoulder: Secondary | ICD-10-CM | POA: Diagnosis not present

## 2020-08-04 DIAGNOSIS — S5002XA Contusion of left elbow, initial encounter: Secondary | ICD-10-CM | POA: Diagnosis not present

## 2020-08-04 DIAGNOSIS — Y998 Other external cause status: Secondary | ICD-10-CM | POA: Insufficient documentation

## 2020-08-04 DIAGNOSIS — M25522 Pain in left elbow: Secondary | ICD-10-CM | POA: Diagnosis not present

## 2020-08-04 DIAGNOSIS — F17210 Nicotine dependence, cigarettes, uncomplicated: Secondary | ICD-10-CM | POA: Insufficient documentation

## 2020-08-04 DIAGNOSIS — Y9289 Other specified places as the place of occurrence of the external cause: Secondary | ICD-10-CM | POA: Diagnosis not present

## 2020-08-04 DIAGNOSIS — M25512 Pain in left shoulder: Secondary | ICD-10-CM | POA: Diagnosis not present

## 2020-08-04 HISTORY — DX: Bipolar disorder, unspecified: F31.9

## 2020-08-04 HISTORY — DX: Raynaud's syndrome without gangrene: I73.00
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
## 2020-10-16 ENCOUNTER — Other Ambulatory Visit: Payer: Self-pay

## 2020-10-16 ENCOUNTER — Ambulatory Visit
Admission: RE | Admit: 2020-10-16 | Discharge: 2020-10-16 | Disposition: A | Discharge status: Home / Self Care | Payer: BC Managed Care – PPO | Source: Ambulatory Visit | Attending: Physician Assistant | Admitting: Physician Assistant

## 2020-10-16 VITALS — BP 150/93 | HR 92 | Temp 98.3°F | Resp 18

## 2020-10-16 DIAGNOSIS — J0100 Acute maxillary sinusitis, unspecified: Secondary | ICD-10-CM | POA: Diagnosis not present

## 2020-10-16 MED ORDER — AMOXICILLIN-POT CLAVULANATE 875-125 MG PO TABS: 1.0000 | ORAL_TABLET | Freq: Two times a day (BID) | ORAL | 0 refills | Status: DC

## 2020-10-16 MED ORDER — FLUCONAZOLE 150 MG PO TABS: 150.0000 mg | ORAL_TABLET | Freq: Every day | ORAL | 0 refills | Status: DC

## 2020-10-16 MED ORDER — PREDNISONE 50 MG PO TABS: 50.0000 mg | ORAL_TABLET | Freq: Every day | ORAL | 0 refills | Status: DC

## 2020-10-16 MED ORDER — AZELASTINE HCL 0.1 % NA SOLN: 2.0000 | Freq: Two times a day (BID) | NASAL | 0 refills | Status: DC

## 2020-10-16 NOTE — ED Provider Notes (Signed)
[facility]    CSN: [id number] Arrival date & time: 10/16/20  1358      History   Chief Complaint Chief Complaint  Patient presents with  . Headache    HPI Eileen Bowen is a 44 y.o. female.   44 year old female comes in for 2 week history of URI symptoms. Sinus/frontal pressure, rhinorrhea, nasal congestion, post nasal drip. Denies fever, chills, body aches. Denies shortness of breath. Denies vision changes, nausea/vomiting. Naproxen without relief of sinus pain.      Past Medical History:  Diagnosis Date  . Anxiety   . Bipolar 1 disorder (HCC)   . Depression   . Diabetes mellitus   . Dysmenorrhea   . Fibromyalgia   . Genital warts   . Hypercholesteremia   . Hypertension   . Migraine   . Raynaud's disease     Patient Active Problem List   Diagnosis Date Noted  . Raynaud's phenomenon 10/05/2017  . GAD (generalized anxiety disorder) 04/11/2017  . 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 Diabetes mellitus (HCC)   . Hypertension   . Hypercholesteremia   . Fibromyalgia   . Migraine   . Anxiety   . Depression     Past Surgical History:  Procedure Laterality Date  . CESAREAN SECTION    . CHOLECYSTECTOMY  09/1999  . COLPOSCOPY    . TUBAL LIGATION      OB History    Gravida  2   Para  2   Term  2   Preterm      AB      Living  2     SAB      TAB      Ectopic      Multiple      Live Births  2            Home Medications    Prior to Admission medications   Medication Sig Start Date End Date Taking? Authorizing Provider  amoxicillin-clavulanate (AUGMENTIN) 875-125 MG tablet Take 1 tablet by mouth every 12 (twelve) hours. 10/16/20   Cathie Hoops, Estil Vallee V, PA-C  azelastine (ASTELIN) 0.1 % nasal spray Place 2 sprays into both nostrils 2 (two) times daily. 10/16/20   Cathie Hoops, Haruka Kowaleski V, PA-C  fluconazole (DIFLUCAN) 150 MG tablet Take 1 tablet (150 mg total) by mouth daily. Take second dose 72 hours  later if symptoms still persists. 10/16/20   Cathie Hoops, Bradley Handyside V, PA-C  naproxen sodium (ANAPROX) 220 MG tablet Take 440 mg by mouth 2 (two) times daily with a meal.    [provider]  predniSONE (DELTASONE) 50 MG tablet Take 1 tablet (50 mg total) by mouth daily with breakfast. 10/16/20   Belinda Fisher, PA-C    Family History Family History  Problem Relation Age of Onset  . Diabetes Mother   . Fibromyalgia Sister   . Hypertension Maternal Aunt   . Thyroid disease Maternal Grandmother   . Asthma Son     Social History Social History   Tobacco Use  . Smoking status: Current Every Day Smoker    Packs/day: 0.50    Years: 21.00    Pack years: 10.50    Types: Cigarettes  . Smokeless tobacco: Never Used  Vaping Use  . Vaping Use: Never used  Substance Use Topics  . Alcohol use: Yes    Alcohol/week: 1.0 standard drink    Types: 1 Standard drinks or equivalent per week  .  Drug use: No     Allergies   Cashew nut oil   Review of Systems Review of Systems  Reason unable to perform ROS: See HPI as above.     Physical Exam Triage Vital Signs ED Triage Vitals  Enc Vitals Group     BP 10/16/20 1408 (!) 156/100     Pulse Rate 10/16/20 1408 92     Resp 10/16/20 1408 18     Temp 10/16/20 1408 98.3 F (36.8 C)     Temp Source 10/16/20 1408 Oral     SpO2 10/16/20 1408 100 %     Weight --      Height --      Head Circumference --      Peak Flow --      Pain Score 10/16/20 1422 3     Pain Loc --      Pain Edu? --      Excl. in GC? --    No data found.  Updated Vital Signs BP (!) 150/93 (BP Location: Left Arm)   Pulse 92   Temp 98.3 F (36.8 C) (Oral)   Resp 18   LMP 10/06/2020   SpO2 100%    Physical Exam Constitutional:      General: She is not in acute distress.    Appearance: Normal appearance. She is well-developed. She is not ill-appearing, toxic-appearing or diaphoretic.  HENT:     Head: Normocephalic and atraumatic.     Right Ear: Tympanic membrane, ear  canal and external ear normal. Tympanic membrane is not erythematous or bulging.     Left Ear: Tympanic membrane, ear canal and external ear normal. Tympanic membrane is not erythematous or bulging.     Nose:     Right Sinus: Maxillary sinus tenderness present. No frontal sinus tenderness.     Left Sinus: Maxillary sinus tenderness present. No frontal sinus tenderness.     Mouth/Throat:     Mouth: Mucous membranes are moist.     Pharynx: Oropharynx is clear. Uvula midline.  Eyes:     Extraocular Movements: Extraocular movements intact.     Conjunctiva/sclera: Conjunctivae normal.     Pupils: Pupils are equal, round, and reactive to light.  Cardiovascular:     Rate and Rhythm: Normal rate and regular rhythm.  Pulmonary:     Effort: Pulmonary effort is normal. No accessory muscle usage, prolonged expiration, respiratory distress or retractions.     Breath sounds: No decreased air movement or transmitted upper airway sounds. No decreased breath sounds.     Comments: LCTAB Musculoskeletal:     Cervical back: Normal range of motion and neck supple.  Skin:    General: Skin is warm and dry.  Neurological:     Mental Status: She is alert and oriented to person, place, and time.     Comments: No facial asymmetry, slurred speech. Strength 5/5 BUE/BLE. Normal gait, ambulating on own without difficulty.       UC Treatments / Results  Labs (all labs ordered are listed, but only abnormal results are displayed) Labs Reviewed - No data to display  EKG   Radiology No results found.  Procedures Procedures (including critical care time)  Medications Ordered in UC Medications - No data to display  Initial Impression / Assessment and Plan / UC Course  I have reviewed the triage vital signs and the nursing notes.  Pertinent labs & imaging results that were available during my care of the patient were reviewed by  me and considered in my medical decision making (see chart for details).      Discussed to treat symptomatically with prednisone/nasal sprays for now. However, given 2 week history of symptoms, will call in augmentin, can fill if symptoms not improving with prednisone. Return precautions given.  Final Clinical Impressions(s) / UC Diagnoses   Final diagnoses:  Acute non-recurrent maxillary sinusitis   ED Prescriptions    Medication Sig Dispense Auth. Provider   predniSONE (DELTASONE) 50 MG tablet Take 1 tablet (50 mg total) by mouth daily with breakfast. 5 tablet Malory Spurr V, PA-C   azelastine (ASTELIN) 0.1 % nasal spray Place 2 sprays into both nostrils 2 (two) times daily. 30 mL Myrel Rappleye V, PA-C   amoxicillin-clavulanate (AUGMENTIN) 875-125 MG tablet Take 1 tablet by mouth every 12 (twelve) hours. 14 tablet Shabrea Weldin V, PA-C   fluconazole (DIFLUCAN) 150 MG tablet Take 1 tablet (150 mg total) by mouth daily. Take second dose 72 hours later if symptoms still persists. 2 tablet Belinda Fisher, PA-C     PDMP not reviewed this encounter.   Belinda Fisher, PA-C 10/16/20 1452

## 2020-10-16 NOTE — Discharge Instructions (Signed)
Start prednisone, azelastine and can add over the counter flonase for now. If symptoms not improving in 3-4 days, can fill augmentin and start for sinus infection. Monitor for any worsening of symptoms, chest pain, shortness of breath, wheezing, swelling of the throat, go to the emergency department for further evaluation needed.

## 2020-10-16 NOTE — ED Triage Notes (Signed)
Pt c/o headache/pressure over eyes for over 2wks. C/o sore throat x2 days, runny nose x2wks.

## 2020-11-10 DIAGNOSIS — I10 Essential (primary) hypertension: Secondary | ICD-10-CM | POA: Diagnosis not present

## 2020-12-09 ENCOUNTER — Other Ambulatory Visit: Payer: BC Managed Care – PPO

## 2020-12-30 ENCOUNTER — Ambulatory Visit (INDEPENDENT_AMBULATORY_CARE_PROVIDER_SITE_OTHER): Payer: BC Managed Care – PPO | Admitting: Primary Care

## 2020-12-30 ENCOUNTER — Encounter (INDEPENDENT_AMBULATORY_CARE_PROVIDER_SITE_OTHER): Payer: Self-pay | Admitting: Primary Care

## 2020-12-30 ENCOUNTER — Other Ambulatory Visit: Payer: Self-pay

## 2020-12-30 VITALS — BP 142/91 | HR 99 | Temp 97.3°F | Ht 64.0 in | Wt 171.4 lb

## 2020-12-30 DIAGNOSIS — E119 Type 2 diabetes mellitus without complications: Secondary | ICD-10-CM | POA: Diagnosis not present

## 2020-12-30 DIAGNOSIS — K581 Irritable bowel syndrome with constipation: Secondary | ICD-10-CM | POA: Diagnosis not present

## 2020-12-30 DIAGNOSIS — Z0189 Encounter for other specified special examinations: Secondary | ICD-10-CM

## 2020-12-30 DIAGNOSIS — I10 Essential (primary) hypertension: Secondary | ICD-10-CM

## 2020-12-30 DIAGNOSIS — Z1211 Encounter for screening for malignant neoplasm of colon: Secondary | ICD-10-CM | POA: Diagnosis not present

## 2020-12-30 DIAGNOSIS — Z7689 Persons encountering health services in other specified circumstances: Secondary | ICD-10-CM

## 2020-12-30 LAB — POCT GLYCOSYLATED HEMOGLOBIN (HGB A1C): Hemoglobin A1C: 5.9 % — AB (ref 4.0–5.6)

## 2020-12-30 MED ORDER — LOSARTAN POTASSIUM 50 MG PO TABS: 50.0000 mg | ORAL_TABLET | Freq: Every day | ORAL | 3 refills | Status: DC

## 2020-12-30 MED ORDER — AMLODIPINE BESYLATE 10 MG PO TABS: 10.0000 mg | ORAL_TABLET | Freq: Every day | ORAL | 3 refills | Status: DC

## 2020-12-30 NOTE — Progress Notes (Signed)
New Patient Office Visit  Subjective:  Patient ID: Eileen Bowen, female    DOB: 1976-04-30  Age: 45 y.o. MRN: [id number]  CC:  Chief Complaint  Patient presents with  . New Patient (Initial Visit)    Diabetes and HTN    HPI Ms. Corinn Stoltzfus is a 45 year old presents for establishment of care and the management of diabetes and hypertension. Denies shortness of breath, headaches, chest pain or lower extremity edema  Past Medical History:  Diagnosis Date  . Anxiety   . Bipolar 1 disorder (HCC)   . Depression   . Diabetes mellitus   . Dysmenorrhea   . Fibromyalgia   . Genital warts   . Hypercholesteremia   . Hypertension   . Migraine   . Raynaud's disease     Past Surgical History:  Procedure Laterality Date  . CESAREAN SECTION    . CHOLECYSTECTOMY  09/1999  . COLPOSCOPY    . TUBAL LIGATION      Family History  Problem Relation Age of Onset  . Diabetes Mother   . Fibromyalgia Sister   . Hypertension Maternal Aunt   . Thyroid disease Maternal Grandmother   . Asthma Son     Social History   Socioeconomic History  . Marital status: Single    Spouse name: Not on file  . Number of children: Not on file  . Years of education: Not on file  . Highest education level: Not on file  Occupational History  . Not on file  Tobacco Use  . Smoking status: Current Every Day Smoker    Packs/day: 0.50    Years: 21.00    Pack years: 10.50    Types: Cigarettes  . Smokeless tobacco: Never Used  Vaping Use  . Vaping Use: Never used  Substance and Sexual Activity  . Alcohol use: Yes    Alcohol/week: 1.0 standard drink    Types: 1 Standard drinks or equivalent per week  . Drug use: No  . Sexual activity: Yes    Partners: Male    Birth control/protection: Surgical  Other Topics Concern  . Not on file  Social History Narrative  . Not on file   Social Determinants of Health   Financial Resource Strain: Not on file  Food Insecurity: Not on file  Transportation  Needs: Not on file  Physical Activity: Not on file  Stress: Not on file  Social Connections: Not on file  Intimate Partner Violence: Not on file    ROS Review of Systems  Endocrine: Positive for polyuria.  Musculoskeletal: Positive for neck pain.       X's 4 weeks  Psychiatric/Behavioral: Positive for sleep disturbance.  All other systems reviewed and are negative.   Objective:   Today's Vitals: BP (!) 142/91 (BP Location: Right Arm, Patient Position: Sitting, Cuff Size: Large)   Temp (!) 97.3 F (36.3 C) (Temporal)   Ht 5\' 4"  (1.626 m)   Wt 171 lb 6.4 oz (77.7 kg)   LMP 12/29/2020 (Exact Date)   BMI 29.42 kg/m   Physical Exam Vitals reviewed.  Constitutional:      Appearance: Normal appearance.  HENT:     Head: Normocephalic.     Right Ear: Tympanic membrane and external ear normal.     Left Ear: Tympanic membrane and external ear normal.     Nose: Nose normal.  Eyes:     Extraocular Movements: Extraocular movements intact.     Pupils: Pupils are equal, round, and  reactive to light.  Cardiovascular:     Rate and Rhythm: Normal rate and regular rhythm.  Pulmonary:     Effort: Pulmonary effort is normal.     Breath sounds: Normal breath sounds.  Abdominal:     General: Bowel sounds are normal.     Palpations: Abdomen is soft.  Musculoskeletal:        General: Normal range of motion.     Cervical back: Normal range of motion.  Skin:    General: Skin is warm and dry.  Neurological:     Mental Status: She is alert and oriented to person, place, and time.  Psychiatric:        Mood and Affect: Mood normal.        Behavior: Behavior normal.        Thought Content: Thought content normal.        Judgment: Judgment normal.     Assessment & Plan:  Yanina was seen today for new patient (initial visit).  Diagnoses and all orders for this visit:  Type 2 diabetes mellitus without complication, without long-term current use of insulin (HCC) Therapeutic goals for  glycemic control related to A1c measurements: Goal of therapy: Less than 6.5 hemoglobin A1c. Today 5.9 (A1C)  She is monitor foods that are high in carbohydrates are the following rice, potatoes, breads, sugars, and pastas.  Reduction in the intake (eating) will assist in lowering your blood sugars. Continue lifestyle modifications.  -     HgB A1c -     Microalbumin/Creatinine Ratio, Urine  Encounter to establish care Establish care   Essential hypertension Counseled on blood pressure goal of less than 130/80, low-sodium, DASH diet, medication compliance, 150 minutes of moderate intensity exercise per week. Discussed medication compliance, adverse effects.  Colon cancer screening Refer to gastrology   Comprehensive diabetic foot examination, type 2 DM, encounter for [facility]) Completed diabetic foot exam   Irritable bowel syndrome with constipation Increasing fiber and healthier takes probiotics   Outpatient Encounter Medications as of 12/30/2020  Medication Sig  . naproxen sodium (ANAPROX) 220 MG tablet Take 440 mg by mouth 2 (two) times daily with a meal.  . [DISCONTINUED] amoxicillin-clavulanate (AUGMENTIN) 875-125 MG tablet Take 1 tablet by mouth every 12 (twelve) hours.  . [DISCONTINUED] azelastine (ASTELIN) 0.1 % nasal spray Place 2 sprays into both nostrils 2 (two) times daily.  . [DISCONTINUED] fluconazole (DIFLUCAN) 150 MG tablet Take 1 tablet (150 mg total) by mouth daily. Take second dose 72 hours later if symptoms still persists.  . [DISCONTINUED] predniSONE (DELTASONE) 50 MG tablet Take 1 tablet (50 mg total) by mouth daily with breakfast.   No facility-administered encounter medications on file as of 12/30/2020.    Follow-up: No follow-ups on file.   [name], NP

## 2020-12-30 NOTE — Patient Instructions (Signed)

## 2020-12-31 LAB — CMP14+EGFR
ALT: 11 IU/L (ref 0–32)
AST: 22 IU/L (ref 0–40)
Albumin/Globulin Ratio: 1.1 — ABNORMAL LOW (ref 1.2–2.2)
Albumin: 4 g/dL (ref 3.8–4.8)
Alkaline Phosphatase: 92 IU/L (ref 44–121)
BUN/Creatinine Ratio: 11 (ref 9–23)
BUN: 8 mg/dL (ref 6–24)
Bilirubin Total: 0.3 mg/dL (ref 0.0–1.2)
CO2: 22 mmol/L (ref 20–29)
Calcium: 9.4 mg/dL (ref 8.7–10.2)
Chloride: 102 mmol/L (ref 96–106)
Creatinine, Ser: 0.73 mg/dL (ref 0.57–1.00)
GFR calc Af Amer: 114 mL/min/{1.73_m2} (ref >59)
GFR calc non Af Amer: 99 mL/min/{1.73_m2} (ref >59)
Globulin, Total: 3.8 g/dL (ref 1.5–4.5)
Glucose: 113 mg/dL — ABNORMAL HIGH (ref 65–99)
Potassium: 4.5 mmol/L (ref 3.5–5.2)
Sodium: 138 mmol/L (ref 134–144)
Total Protein: 7.8 g/dL (ref 6.0–8.5)

## 2020-12-31 LAB — CBC WITH DIFFERENTIAL/PLATELET
Basophils Absolute: 0.1 10*3/uL (ref 0.0–0.2)
Basos: 1 %
EOS (ABSOLUTE): 0.7 10*3/uL — ABNORMAL HIGH (ref 0.0–0.4)
Eos: 10 %
Hematocrit: 38.4 % (ref 34.0–46.6)
Hemoglobin: 13.5 g/dL (ref 11.1–15.9)
Immature Grans (Abs): 0 10*3/uL (ref 0.0–0.1)
Immature Granulocytes: 0 %
Lymphocytes Absolute: 1.7 10*3/uL (ref 0.7–3.1)
Lymphs: 24 %
MCH: 34.4 pg — ABNORMAL HIGH (ref 26.6–33.0)
MCHC: 35.2 g/dL (ref 31.5–35.7)
MCV: 98 fL — ABNORMAL HIGH (ref 79–97)
Monocytes Absolute: 0.6 10*3/uL (ref 0.1–0.9)
Monocytes: 9 %
Neutrophils Absolute: 4 10*3/uL (ref 1.4–7.0)
Neutrophils: 56 %
Platelets: 436 10*3/uL (ref 150–450)
RBC: 3.92 x10E6/uL (ref 3.77–5.28)
RDW: 12.5 % (ref 11.7–15.4)
WBC: 7.1 10*3/uL (ref 3.4–10.8)

## 2020-12-31 LAB — LIPID PANEL
Chol/HDL Ratio: 6.3 ratio — ABNORMAL HIGH (ref 0.0–4.4)
Cholesterol, Total: 201 mg/dL — ABNORMAL HIGH (ref 100–199)
HDL: 32 mg/dL — ABNORMAL LOW (ref >39)
LDL Chol Calc (NIH): 151 mg/dL — ABNORMAL HIGH (ref 0–99)
Triglycerides: 98 mg/dL (ref 0–149)
VLDL Cholesterol Cal: 18 mg/dL (ref 5–40)

## 2020-12-31 LAB — TSH+FREE T4
Free T4: 1.36 ng/dL (ref 0.82–1.77)
TSH: 2.61 u[IU]/mL (ref 0.450–4.500)

## 2021-01-12 ENCOUNTER — Ambulatory Visit (INDEPENDENT_AMBULATORY_CARE_PROVIDER_SITE_OTHER): Payer: BC Managed Care – PPO | Admitting: Primary Care

## 2021-01-13 ENCOUNTER — Other Ambulatory Visit: Payer: Self-pay

## 2021-01-13 ENCOUNTER — Encounter (INDEPENDENT_AMBULATORY_CARE_PROVIDER_SITE_OTHER): Payer: Self-pay | Admitting: Primary Care

## 2021-01-13 ENCOUNTER — Other Ambulatory Visit (HOSPITAL_COMMUNITY)
Admission: RE | Admit: 2021-01-13 | Discharge: 2021-01-13 | Disposition: A | Discharge status: Home / Self Care | Payer: BC Managed Care – PPO | Source: Ambulatory Visit | Attending: Primary Care | Admitting: Primary Care

## 2021-01-13 ENCOUNTER — Ambulatory Visit (INDEPENDENT_AMBULATORY_CARE_PROVIDER_SITE_OTHER): Payer: BC Managed Care – PPO | Admitting: Primary Care

## 2021-01-13 VITALS — BP 127/91 | HR 100 | Temp 97.3°F | Ht 64.0 in | Wt 174.2 lb

## 2021-01-13 DIAGNOSIS — Z1211 Encounter for screening for malignant neoplasm of colon: Secondary | ICD-10-CM

## 2021-01-13 DIAGNOSIS — Z1231 Encounter for screening mammogram for malignant neoplasm of breast: Secondary | ICD-10-CM | POA: Diagnosis not present

## 2021-01-13 DIAGNOSIS — E119 Type 2 diabetes mellitus without complications: Secondary | ICD-10-CM | POA: Diagnosis not present

## 2021-01-13 DIAGNOSIS — Z124 Encounter for screening for malignant neoplasm of cervix: Secondary | ICD-10-CM

## 2021-01-13 DIAGNOSIS — E118 Type 2 diabetes mellitus with unspecified complications: Secondary | ICD-10-CM

## 2021-01-13 DIAGNOSIS — N898 Other specified noninflammatory disorders of vagina: Secondary | ICD-10-CM

## 2021-01-13 NOTE — Patient Instructions (Signed)

## 2021-01-13 NOTE — Progress Notes (Signed)
Established Patient Office Visit  Subjective:  Patient ID: Eileen Bowen, female    DOB: 1976/10/04  Age: 45 y.o. MRN: [id number]  CC:  Chief Complaint  Patient presents with  . Gynecologic Exam  . Blood Pressure Check    HPI Eileen Bowen is a 45 year old female who presents for wellness gyn visit. She voices no concerns. Denies shortness of breath, headaches, chest pain or lower extremity edema. She would like STD screening.   Past Medical History:  Diagnosis Date  . Anxiety   . Bipolar 1 disorder (HCC)   . Depression   . Diabetes mellitus   . Dysmenorrhea   . Fibromyalgia   . Genital warts   . Hypercholesteremia   . Hypertension   . Migraine   . Raynaud's disease     Past Surgical History:  Procedure Laterality Date  . CESAREAN SECTION    . CHOLECYSTECTOMY  09/1999  . COLPOSCOPY    . TUBAL LIGATION      Family History  Problem Relation Age of Onset  . Diabetes Mother   . Fibromyalgia Sister   . Hypertension Maternal Aunt   . Thyroid disease Maternal Grandmother   . Asthma Son     Social History   Socioeconomic History  . Marital status: Single    Spouse name: Not on file  . Number of children: Not on file  . Years of education: Not on file  . Highest education level: Not on file  Occupational History  . Not on file  Tobacco Use  . Smoking status: Current Every Day Smoker    Packs/day: 0.50    Years: 21.00    Pack years: 10.50    Types: Cigarettes  . Smokeless tobacco: Never Used  Vaping Use  . Vaping Use: Never used  Substance and Sexual Activity  . Alcohol use: Yes    Alcohol/week: 1.0 standard drink    Types: 1 Standard drinks or equivalent per week  . Drug use: No  . Sexual activity: Yes    Partners: Male    Birth control/protection: Surgical  Other Topics Concern  . Not on file  Social History Narrative  . Not on file   Social Determinants of Health   Financial Resource Strain: Not on file  Food Insecurity: Not on  file  Transportation Needs: Not on file  Physical Activity: Not on file  Stress: Not on file  Social Connections: Not on file  Intimate Partner Violence: Not on file    Outpatient Medications Prior to Visit  Medication Sig Dispense Refill  . amLODipine (NORVASC) 10 MG tablet Take 1 tablet (10 mg total) by mouth daily. 90 tablet 3  . losartan (COZAAR) 50 MG tablet Take 1 tablet (50 mg total) by mouth daily. 90 tablet 3  . naproxen sodium (ANAPROX) 220 MG tablet Take 440 mg by mouth 2 (two) times daily with a meal.     No facility-administered medications prior to visit.    Allergies  Allergen Reactions  . Cashew Nut Oil     ROS    Objective:    Physical Exam General appearance - well appearing, and in no distress             Mental status - alert, oriented to person, place, and time             Psych:  She has a normal mood and affect  Skin - warm and dry, normal color, no suspicious lesions noted             Chest - effort normal, all lung fields clear to auscultation bilaterally             Heart - normal rate and regular rhythm             Neck:  midline trachea, no thyromegaly or nodules             Breasts -Taught SBE             Abdomen - soft, nontender, nondistended, no masses or organomegaly             Pelvic - VULVA: normal appearing vulva with no masses, tenderness or lesions   VAGINA: normal appearing vagina with normal color and discharge, no lesions       CERVIX: normal              appearing cervix without discharge or lesions, no CMT             Thin prep pap is done with HR HPV cotesting             UTERUS: uterus is felt to be normal size, shape, consistency and nontender              ADNEXA: No adnexal masses or tenderness noted.             Rectal - deferred             Extremities:  No swelling or varicosities noted  BP (!) 127/91 (BP Location: Right Arm, Patient Position: Sitting, Cuff Size: Normal)   Pulse 100   Temp (!) 97.3 F (36.3  C) (Temporal)   Ht 5\' 4"  (1.626 m)   Wt 174 lb 3.2 oz (79 kg)   LMP 12/29/2020 (Exact Date)   SpO2 92%   BMI 29.90 kg/m  Wt Readings from Last 3 Encounters:  01/13/21 174 lb 3.2 oz (79 kg)  12/30/20 171 lb 6.4 oz (77.7 kg)  08/04/20 173 lb (78.5 kg)     Health Maintenance Due  Topic Date Due  . OPHTHALMOLOGY EXAM  Never done  . PAP SMEAR-Modifier  06/30/2018  . COLONOSCOPY (Pts 45-24yrs Insurance coverage will need to be confirmed)  Never done  . COVID-19 Vaccine (3 - Booster for Pfizer series) 09/27/2020    There are no preventive care reminders to display for this patient.  Lab Results  Component Value Date   TSH 2.610 12/30/2020   Lab Results  Component Value Date   WBC 7.1 12/30/2020   HGB 13.5 12/30/2020   HCT 38.4 12/30/2020   MCV 98 (H) 12/30/2020   PLT 436 12/30/2020   Lab Results  Component Value Date   NA 138 12/30/2020   K 4.5 12/30/2020   CO2 22 12/30/2020   GLUCOSE 113 (H) 12/30/2020   BUN 8 12/30/2020   CREATININE 0.73 12/30/2020   BILITOT 0.3 12/30/2020   ALKPHOS 92 12/30/2020   AST 22 12/30/2020   ALT 11 12/30/2020   PROT 7.8 12/30/2020   ALBUMIN 4.0 12/30/2020   CALCIUM 9.4 12/30/2020   ANIONGAP 8 04/10/2017   Lab Results  Component Value Date   CHOL 201 (H) 12/30/2020   Lab Results  Component Value Date   HDL 32 (L) 12/30/2020   Lab Results  Component Value Date   LDLCALC 151 (H) 12/30/2020   Lab Results  Component Value  Date   TRIG 98 12/30/2020   Lab Results  Component Value Date   CHOLHDL 6.3 (H) 12/30/2020   Lab Results  Component Value Date   HGBA1C 5.9 (A) 12/30/2020      Assessment & Plan:   Eileen Bowen was seen today for gynecologic exam and blood pressure check.  Diagnoses and all orders for this visit:  Cervical cancer screening -     Cytology - PAP([place])  Vaginal discharge -     Cervicovaginal ancillary only  Encounter for screening mammogram for malignant neoplasm of breast -     MM Digital  Screening; Future  Colon cancer screening -     Ambulatory referral to Gastroenterology   No orders of the defined types were placed in this encounter.   Follow-up: Return in about 6 months (around 07/13/2021) for Bp/ DM in person.    [name], NP

## 2021-01-14 LAB — MICROALBUMIN / CREATININE URINE RATIO
Creatinine, Urine: 17.5 mg/dL
Microalb/Creat Ratio: <17 mg/g creat (ref 0–29)
Microalbumin, Urine: <3 ug/mL

## 2021-01-16 LAB — CERVICOVAGINAL ANCILLARY ONLY
Bacterial Vaginitis (gardnerella): NEGATIVE
Candida Glabrata: NEGATIVE
Candida Vaginitis: POSITIVE — AB
Chlamydia: NEGATIVE
Comment: NEGATIVE
Comment: NEGATIVE
Comment: NEGATIVE
Comment: NEGATIVE
Comment: NEGATIVE
Comment: NORMAL
Neisseria Gonorrhea: NEGATIVE
Trichomonas: POSITIVE — AB

## 2021-01-18 ENCOUNTER — Other Ambulatory Visit (INDEPENDENT_AMBULATORY_CARE_PROVIDER_SITE_OTHER): Payer: Self-pay | Admitting: Primary Care

## 2021-01-18 DIAGNOSIS — B379 Candidiasis, unspecified: Secondary | ICD-10-CM

## 2021-01-18 DIAGNOSIS — A599 Trichomoniasis, unspecified: Secondary | ICD-10-CM

## 2021-01-18 MED ORDER — METRONIDAZOLE 500 MG PO TABS: 500.0000 mg | ORAL_TABLET | Freq: Two times a day (BID) | ORAL | 0 refills | Status: DC

## 2021-01-18 MED ORDER — FLUCONAZOLE 150 MG PO TABS: 150.0000 mg | ORAL_TABLET | Freq: Once | ORAL | 0 refills | Status: AC

## 2021-01-19 ENCOUNTER — Telehealth (INDEPENDENT_AMBULATORY_CARE_PROVIDER_SITE_OTHER): Payer: Self-pay

## 2021-01-19 LAB — CYTOLOGY - PAP
Adequacy: ABSENT
Diagnosis: NEGATIVE

## 2021-01-19 NOTE — Telephone Encounter (Signed)
-----   Message from [name], NP sent at 01/18/2021 12:10 AM EST ----- Patient has not read results Trichomonas Positive  and Eileen Bowen

## 2021-01-19 NOTE — Telephone Encounter (Signed)
Contacted patient as she did not view results on [facility]. She verified date of birth. She is aware of positive yeast/trichomonas. Medication sent to treat STI but needs Rx for yeast. Please send to patients pharmacy. Patient did verbalize understanding of results. Maryjean Morn, CMA

## 2021-01-20 ENCOUNTER — Other Ambulatory Visit (INDEPENDENT_AMBULATORY_CARE_PROVIDER_SITE_OTHER): Payer: Self-pay | Admitting: Primary Care

## 2021-01-20 DIAGNOSIS — B379 Candidiasis, unspecified: Secondary | ICD-10-CM

## 2021-01-20 MED ORDER — FLUCONAZOLE 150 MG PO TABS: 150.0000 mg | ORAL_TABLET | Freq: Once | ORAL | 0 refills | Status: AC

## 2021-01-24 ENCOUNTER — Other Ambulatory Visit (INDEPENDENT_AMBULATORY_CARE_PROVIDER_SITE_OTHER): Payer: Self-pay | Admitting: Primary Care

## 2021-01-24 DIAGNOSIS — I10 Essential (primary) hypertension: Secondary | ICD-10-CM

## 2021-01-24 NOTE — Telephone Encounter (Signed)
Medication prescribed on nationwide backorder. Alternate med. Requested- routed to [facility].

## 2021-02-12 ENCOUNTER — Other Ambulatory Visit (INDEPENDENT_AMBULATORY_CARE_PROVIDER_SITE_OTHER): Payer: Self-pay | Admitting: Family Medicine

## 2021-02-12 DIAGNOSIS — I10 Essential (primary) hypertension: Secondary | ICD-10-CM

## 2021-02-12 MED ORDER — LOSARTAN POTASSIUM 25 MG PO TABS: 50.0000 mg | ORAL_TABLET | Freq: Every day | ORAL | 2 refills | Status: DC

## 2021-02-12 NOTE — Telephone Encounter (Signed)
   Notes to clinic:   Product Backordered/Unavailable:50 MGON BACK ORDER ONLY HAVE 25MG  IN STOCK  Requested Prescriptions  Pending Prescriptions Disp Refills   losartan (COZAAR) 50 MG tablet [Pharmacy Med Name: LOSARTAN POTASSIUM 50 MG TAB] 90 tablet 1    Sig: TAKE 1 TABLET BY MOUTH EVERY DAY      Cardiovascular:  Angiotensin Receptor Blockers Failed - 02/12/2021 11:15 AM      Failed - Last BP in normal range    BP Readings from Last 1 Encounters:  01/13/21 (!) 127/91          Passed - Cr in normal range and within 180 days    Creat  Date Value Ref Range Status  07/01/2015 0.71 0.50 - 1.10 mg/dL Final   Creatinine, Ser  Date Value Ref Range Status  12/30/2020 0.73 0.57 - 1.00 mg/dL Final          Passed - K in normal range and within 180 days    Potassium  Date Value Ref Range Status  12/30/2020 4.5 3.5 - 5.2 mmol/L Final          Passed - Patient is not pregnant      Passed - Valid encounter within last 6 months    Recent Outpatient Visits           1 month ago Cervical cancer screening   [facility] P, NP   1 month ago Type 2 diabetes mellitus without complication, without long-term current use of insulin (HCC)   [facility] Gwinda Passe, NP   3 years ago Insect bite, initial encounter   [facility], PA-C   3 years ago Type 2 diabetes mellitus with complication, without long-term current use of insulin [facility])   [facility], PA-C   3 years ago Chronic pain syndrome   [facility], PA-C

## 2021-02-16 ENCOUNTER — Encounter: Payer: Self-pay | Admitting: Internal Medicine

## 2021-03-19 ENCOUNTER — Ambulatory Visit
Admission: RE | Admit: 2021-03-19 | Discharge: 2021-03-19 | Disposition: A | Discharge status: Home / Self Care | Payer: BC Managed Care – PPO | Source: Ambulatory Visit | Attending: Primary Care | Admitting: Primary Care

## 2021-03-19 ENCOUNTER — Other Ambulatory Visit: Payer: Self-pay

## 2021-03-19 DIAGNOSIS — Z1231 Encounter for screening mammogram for malignant neoplasm of breast: Secondary | ICD-10-CM | POA: Diagnosis not present

## 2021-04-16 ENCOUNTER — Telehealth: Payer: Self-pay | Admitting: *Deleted

## 2021-04-16 NOTE — Telephone Encounter (Signed)
Patient no show PV appointment for today. I called patient, no answer, left a message for the patient to call us back today before 5 pm or the PV and procedure will be cancelled

## 2021-04-16 NOTE — Telephone Encounter (Signed)
Patient did not call us back today. No show letter mailed and appointments cancelled.

## 2021-04-16 NOTE — Telephone Encounter (Signed)
Called patient, no answer left a message for her to call us back before 5 pm today.

## 2021-04-30 ENCOUNTER — Encounter: Payer: BC Managed Care – PPO | Admitting: Internal Medicine

## 2021-05-07 ENCOUNTER — Ambulatory Visit: Payer: Self-pay

## 2021-05-07 NOTE — Telephone Encounter (Signed)
Reason for Disposition  [1] MODERATE dizziness (e.g., interferes with normal activities) AND [2] has NOT been evaluated by physician for this  (Exception: dizziness caused by heat exposure, sudden standing, or poor fluid intake)  Answer Assessment - Initial Assessment Questions 1. DESCRIPTION: "Describe your dizziness."   Dizzy 2. LIGHTHEADED: "Do you feel lightheaded?" (e.g., somewhat faint, woozy, weak upon standing)    Yes 3. VERTIGO: "Do you feel like either you or the room is spinning or tilting?" (i.e. vertigo)    No 4. SEVERITY: "How bad is it?"  "Do you feel like you are going to faint?" "Can you stand and walk?"   - MILD: Feels slightly dizzy, but walking normally.   - MODERATE: Feels unsteady when walking, but not falling; interferes with normal activities (e.g., school, work).   - SEVERE: Unable to walk without falling, or requires assistance to walk without falling; feels like passing out now.     Mild 5. ONSET:  "When did the dizziness begin?"     Saturday 6. AGGRAVATING FACTORS: "Does anything make it worse?" (e.g., standing, change in head position)    Movement 7. HEART RATE: "Can you tell me your heart rate?" "How many beats in 15 seconds?"  (Note: not all patients can do this)      No 8. CAUSE: "What do you think is causing the dizziness?"     BP medicine 9. RECURRENT SYMPTOM: "Have you had dizziness before?" If Yes, ask: "When was the last time?" "What happened that time?"     No 10. OTHER SYMPTOMS: "Do you have any other symptoms?" (e.g., fever, chest pain, vomiting, diarrhea, bleeding)      Feels stressed out 11. PREGNANCY: "Is there any chance you are pregnant?" "When was your last menstrual period?"       No  Protocols used: Dizziness - Lightheadedness-A-AH

## 2021-05-07 NOTE — Telephone Encounter (Signed)
Pt. Reports she started having dizziness Saturday. Feels lightheaded at times. Feels like it could be stress related or she is also on 2 new BP medications. No availability in the practice this week. Pt. Will go to mobile unit today.

## 2021-06-12 ENCOUNTER — Ambulatory Visit (HOSPITAL_COMMUNITY)
Admission: EM | Admit: 2021-06-12 | Discharge: 2021-06-12 | Disposition: A | Discharge status: Home / Self Care | Payer: Managed Care, Other (non HMO) | Attending: Student | Admitting: Student

## 2021-06-12 ENCOUNTER — Encounter (HOSPITAL_COMMUNITY): Payer: Self-pay | Admitting: Emergency Medicine

## 2021-06-12 ENCOUNTER — Other Ambulatory Visit: Payer: Self-pay

## 2021-06-12 DIAGNOSIS — E119 Type 2 diabetes mellitus without complications: Secondary | ICD-10-CM | POA: Diagnosis not present

## 2021-06-12 DIAGNOSIS — M7661 Achilles tendinitis, right leg: Secondary | ICD-10-CM | POA: Diagnosis not present

## 2021-06-12 DIAGNOSIS — E1169 Type 2 diabetes mellitus with other specified complication: Secondary | ICD-10-CM | POA: Diagnosis not present

## 2021-06-12 MED ORDER — PREDNISONE 20 MG PO TABS: 40.0000 mg | ORAL_TABLET | Freq: Every day | ORAL | 0 refills | Status: AC

## 2021-06-12 NOTE — ED Triage Notes (Signed)
Pt is present today with right ankle pain and discoloration. Pt states that she noticed the pain three weeks ago. Pt states that it is painful to even bare weight down on her ankle. Pt describes the pain as throbbing. Pt denies any injury

## 2021-06-12 NOTE — Discharge Instructions (Addendum)
-  Prednisone, 2 pills taken at the same time for 5 days in a row.  Try taking this earlier in the day as it can give you energy. Avoid NSAIDs like ibuprofen and alleve while taking this medication as they can increase your risk of stomach upset and even GI bleeding when in combination with a steroid. You can continue tylenol (acetaminophen) up to 1000mg  3x daily. -Rest, ice, elevation -Use your brace while walking and standing as long as pain persists -Work note provided- limit prolonged standing and walking -Follow-up with PCP or ortho if symptoms persist, information below -Discuss chronic conditions with PCP

## 2021-06-12 NOTE — ED Provider Notes (Signed)
[facility]    CSN: [id number] Arrival date & time: 06/12/21  2952      History   Chief Complaint Chief Complaint  Patient presents with   Ankle Pain    Right     HPI Eileen Bowen is a 45 y.o. female presenting with R ankle pain and discoloration x3 weeks. Medical history diabetes, fibromyalgia, raynaud's.  Endorses pain over the right Achilles tendon for 3 weeks.  States she thinks this is related to her job which involves a lot of standing and walking, but she has been unable to cut back on hours despite symptoms.  She has been using hydrocortisone ointment, ice with minimal improvement.  States that the discoloration began after she started treating with various over-the-counter ointments.  Pain with walking and standing.  Feeling well otherwise, states diabetes is well controlled.  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 Authorizing  Provider  amLODipine (NORVASC) 10 MG tablet Take 1 tablet (10 mg total) by mouth daily. 12/30/20  Yes [name], NP  predniSONE (DELTASONE) 20 MG tablet Take 2 tablets (40 mg total) by mouth daily for 5 days. 06/12/21 06/17/21 Yes Rhys Martini, PA-C  losartan (COZAAR) 25 MG tablet Take 2 tablets (50 mg total) by mouth daily. 02/12/21   Hoy Register, MD  metroNIDAZOLE (FLAGYL) 500 MG tablet Take 1 tablet (500 mg total) by mouth 2 (two) times daily. 01/18/21   [name], NP  naproxen sodium (ANAPROX) 220 MG tablet Take 440 mg by mouth 2 (two) times daily with a meal.    [provider]    Family History Family History  Problem Relation Age of Onset   Diabetes Mother    Fibromyalgia Sister    Hypertension Maternal Aunt    Thyroid disease Maternal Grandmother    Asthma Son     Social History Social History   Tobacco Use   Smoking status: Every Day    Packs/day: 0.50  Years: 21.00    Pack years: 10.50    Types: Cigarettes   Smokeless tobacco: Never  Vaping Use   Vaping Use: Never used  Substance Use Topics   Alcohol use: Yes    Alcohol/week: 1.0 standard drink    Types: 1 Standard drinks or equivalent per week   Drug use: No     Allergies   Cashew nut oil   Review of Systems Review of Systems  Musculoskeletal:        R ankle pain  All other systems reviewed and are negative.   Physical Exam Triage Vital Signs ED Triage Vitals  Enc Vitals Group     BP 06/12/21 1001 (!) 142/89     Pulse Rate 06/12/21 1001 72     Resp 06/12/21 1001 18     Temp 06/12/21 1001 99 F (37.2 C)     Temp Source 06/12/21 1001 Oral     SpO2 06/12/21 1001 100 %     Weight --      Height --      Head Circumference --      Peak Flow --      Pain Score 06/12/21 1000 2     Pain Loc --      Pain Edu? --      Excl. in GC? --    No data found.  Updated Vital Signs BP (!) 142/89 (BP Location: Right Arm)   Pulse 72   Temp 99 F (37.2 C) (Oral)   Resp 18    SpO2 100%   Visual Acuity Right Eye Distance:   Left Eye Distance:   Bilateral Distance:    Right Eye Near:   Left Eye Near:    Bilateral Near:     Physical Exam Vitals reviewed.  Constitutional:      General: She is not in acute distress.    Appearance: Normal appearance. She is not ill-appearing or diaphoretic.  HENT:     Head: Normocephalic and atraumatic.  Cardiovascular:     Rate and Rhythm: Normal rate and regular rhythm.     Heart sounds: Normal heart sounds.  Pulmonary:     Effort: Pulmonary effort is normal.     Breath sounds: Normal breath sounds.  Musculoskeletal:     Comments: R ankle- TTP over achilles tendon. Pain with plantar and dorsiflexion. Gait intact but with pain.  No tenderness over medial or lateral malleolus.  Mild effusion and hyperpigmented skin over Achilles tendon.  Sensation intact, DP 2+, cap refill less than 2 seconds.  No warmth or erythema.  Skin:    General: Skin is warm.  Neurological:     General: No focal deficit present.     Mental Status: She is alert and oriented to person, place, and time.  Psychiatric:        Mood and Affect: Mood normal.        Behavior: Behavior normal.        Thought Content: Thought content normal.        Judgment: Judgment normal.     UC Treatments / Results  Labs (all labs ordered are listed, but only abnormal results are displayed) Labs Reviewed - No data to display  EKG   Radiology No results found.  Procedures Procedures (including critical care time)  Medications Ordered in UC Medications - No data to display  Initial Impression / Assessment and Plan / UC Course  I have reviewed the triage vital signs and the nursing notes.  Pertinent labs & imaging results that were available during my care of the patient were reviewed by me and considered in my medical decision making (see chart for details).     This patient is a very pleasant 45 y.o. year old female presenting with achilles  tendonitis. Suspect skin hyperpigmentation is due to over-the-counter remedies including hydrocortisone. Prednisone sent as below, A1c 5.8 last check. F/u with PCP for multiple chronic conditions. Work note provided, f/u with ortho. ED return precautions discussed. Patient verbalizes understanding and agreement.    Final Clinical Impressions(s) / UC Diagnoses   Final diagnoses:  Achilles tendinitis of right lower extremity  Diabetes mellitus type 2, diet-controlled (HCC)  Type 2 diabetes mellitus with other specified complication, without long-term current use of insulin (HCC)     Discharge Instructions      -Prednisone, 2 pills taken at the same time for 5 days in a row.  Try taking this earlier in the day as it can give you energy. Avoid NSAIDs like ibuprofen and alleve while taking this medication as they can increase your risk of stomach upset and even GI bleeding when in combination with a steroid. You can continue tylenol (acetaminophen) up to 1000mg  3x daily. -Rest, ice, elevation -Use your brace while walking and standing as long as pain persists -Work note provided- limit prolonged standing and walking -Follow-up with PCP or ortho if symptoms persist, information below -Discuss chronic conditions with PCP     ED Prescriptions     Medication Sig Dispense Auth. Provider   predniSONE (DELTASONE) 20 MG tablet Take 2 tablets (40 mg total) by mouth daily for 5 days. 10 tablet , PA-C      PDMP not reviewed this encounter.   Rhys Martini, PA-C 06/12/21 1101

## 2021-07-01 ENCOUNTER — Other Ambulatory Visit (INDEPENDENT_AMBULATORY_CARE_PROVIDER_SITE_OTHER): Payer: Self-pay | Admitting: Primary Care

## 2021-07-01 MED ORDER — LOSARTAN POTASSIUM 25 MG PO TABS: 50.0000 mg | ORAL_TABLET | Freq: Every day | ORAL | 1 refills | Status: DC

## 2021-07-01 NOTE — Telephone Encounter (Signed)
Medication: losartan (COZAAR) 25 MG tablet  Has the pt contacted their pharmacy? Yes/but she has changed pharmacy   Preferred pharmacy: [id number] - [place], [place] - [address]  Please be advised refills may take up to 3 business days.  We ask that you follow up with your pharmacy.  Advised pt she needs appt, appt scheduled for  8/25 at 8:30 am  (Thursday is her preferred day)

## 2021-07-23 ENCOUNTER — Other Ambulatory Visit: Payer: Self-pay

## 2021-07-23 ENCOUNTER — Ambulatory Visit (INDEPENDENT_AMBULATORY_CARE_PROVIDER_SITE_OTHER): Payer: Managed Care, Other (non HMO) | Admitting: Primary Care

## 2021-07-23 ENCOUNTER — Encounter (INDEPENDENT_AMBULATORY_CARE_PROVIDER_SITE_OTHER): Payer: Self-pay | Admitting: Primary Care

## 2021-07-23 VITALS — BP 121/87 | HR 87 | Temp 97.5°F | Ht 64.0 in | Wt 161.4 lb

## 2021-07-23 DIAGNOSIS — M7661 Achilles tendinitis, right leg: Secondary | ICD-10-CM | POA: Diagnosis not present

## 2021-07-23 DIAGNOSIS — Z23 Encounter for immunization: Secondary | ICD-10-CM | POA: Diagnosis not present

## 2021-07-23 DIAGNOSIS — Z6827 Body mass index (BMI) 27.0-27.9, adult: Secondary | ICD-10-CM

## 2021-07-23 DIAGNOSIS — R7303 Prediabetes: Secondary | ICD-10-CM | POA: Diagnosis not present

## 2021-07-23 DIAGNOSIS — F418 Other specified anxiety disorders: Secondary | ICD-10-CM | POA: Diagnosis not present

## 2021-07-23 DIAGNOSIS — F17210 Nicotine dependence, cigarettes, uncomplicated: Secondary | ICD-10-CM | POA: Diagnosis not present

## 2021-07-23 DIAGNOSIS — F17200 Nicotine dependence, unspecified, uncomplicated: Secondary | ICD-10-CM

## 2021-07-23 DIAGNOSIS — Z1211 Encounter for screening for malignant neoplasm of colon: Secondary | ICD-10-CM

## 2021-07-23 LAB — POCT GLYCOSYLATED HEMOGLOBIN (HGB A1C): Hemoglobin A1C: 5.7 % — AB (ref 4.0–5.6)

## 2021-07-23 MED ORDER — BUSPIRONE HCL 5 MG PO TABS: 5.0000 mg | ORAL_TABLET | Freq: Two times a day (BID) | ORAL | 1 refills | Status: DC

## 2021-07-23 NOTE — Patient Instructions (Addendum)
Buspirone tablets What is this medication? BUSPIRONE (byoo SPYE rone) is used to treat anxiety disorders. This medicine may be used for other purposes; ask your health care provider orpharmacist if you have questions. COMMON BRAND NAME(S): BuSpar What should I tell my care team before I take this medication? They need to know if you have any of these conditions: kidney or liver disease an unusual or allergic reaction to buspirone, other medicines, foods, dyes, or preservatives pregnant or trying to get pregnant breast-feeding How should I use this medication? Take this medicine by mouth with a glass of water. Follow the directions on the prescription label. You may take this medicine with or without food. To ensure that this medicine always works the same way for you, you should take it either always with or always without food. Take your doses at regular intervals. Do not take your medicine more often than directed. Do not stop taking except onthe advice of your doctor or health care professional. Talk to your pediatrician regarding the use of this medicine in children.Special care may be needed. Overdosage: If you think you have taken too much of this medicine contact apoison control center or emergency room at once. NOTE: This medicine is only for you. Do not share this medicine with others. What if I miss a dose? If you miss a dose, take it as soon as you can. If it is almost time for yournext dose, take only that dose. Do not take double or extra doses. What may interact with this medication? Do not take this medicine with any of the following medications: linezolid MAOIs like Carbex, Eldepryl, Marplan, Nardil, and Parnate methylene blue procarbazine This medicine may also interact with the following medications: diazepam digoxin diltiazem erythromycin grapefruit juice haloperidol medicines for mental depression or mood problems medicines for seizures like carbamazepine,  phenobarbital and phenytoin nefazodone other medications for anxiety rifampin ritonavir some antifungal medicines like itraconazole, ketoconazole, and voriconazole verapamil warfarin This list may not describe all possible interactions. Give your health care provider a list of all the medicines, herbs, non-prescription drugs, or dietary supplements you use. Also tell them if you smoke, drink alcohol, or use illegaldrugs. Some items may interact with your medicine. What should I watch for while using this medication? Visit your doctor or health care professional for regular checks on yourprogress. It may take 1 to 2 weeks before your anxiety gets better. You may get drowsy or dizzy. Do not drive, use machinery, or do anything that needs mental alertness until you know how this drug affects you. Do not stand or sit up quickly, especially if you are an older patient. This reduces the risk of dizzy or fainting spells. Alcohol can make you more drowsy and dizzy.Avoid alcoholic drinks. What side effects may I notice from receiving this medication? Side effects that you should report to your doctor or health care professionalas soon as possible: blurred vision or other vision changes chest pain confusion difficulty breathing feelings of hostility or anger muscle aches and pains numbness or tingling in hands or feet ringing in the ears skin rash and itching vomiting weakness Side effects that usually do not require medical attention (report to yourdoctor or health care professional if they continue or are bothersome): disturbed dreams, nightmares headache nausea restlessness or nervousness sore throat and nasal congestion stomach upset This list may not describe all possible side effects. Call your doctor for medical advice about side effects. You may report side effects to FDA at1-800-FDA-1088. Where should  I keep my medication? Keep out of the reach of children. Store at room temperature  below 30 degrees C (86 degrees F). Protect from light. Keep container tightly closed. Throw away any unused medicine after theexpiration date. NOTE: This sheet is a summary. It may not cover all possible information. If you have questions about this medicine, talk to your doctor, pharmacist, orhealth care provider.  2022 Elsevier/Gold Standard (2010-06-25 18:06:11) [email or web address].shtml">  Depression Screening Depression screening is a tool that your health care provider can use to learn if you have symptoms of depression. Depression is a common condition with many symptoms that are also often found in other conditions. Depression is treatable, but it must first be diagnosed. You may not know that certain feelings, thoughts, and behaviors that you are having can be symptoms of depression. Taking a depression screening test can help you and your health care provider decide if you need more assessment, or if you should be referredto a mental health care provider. What are the screening tests? You may have a physical exam to see if another condition is affecting your mental health. You may have a blood or urine sample taken during the physical exam. You may be interviewed using a screening tool that was developed from research, such as one of these: Patient Health Questionnaire (PHQ). This is a set of either 2 or 9 questions. A health care provider who has been trained to score this screening test uses a guide to assess if your symptoms suggest that you may have depression. Hamilton Depression Rating Scale (HAM-D). This is a set of either 17 or 24 questions. You may be asked to take it again during or after your treatment, to see if your depression has gotten better. Beck Depression Inventory (BDI). This is a set of 21 multiple choice questions. Your health care provider scores your answers to assess: Your level of depression, ranging from  mild to severe. Your response to treatment. Your health care provider may talk with you about your daily activities, such as eating, sleeping, work, and recreation, and ask if you have had any changes in activity. Your health care provider may ask you to see a mental health specialist, such as a psychiatrist or psychologist, for more evaluation. Who should be screened for depression?  All adults, including adults with a family history of a mental health disorder. Adolescents who are 26-63 years old. People who are recovering from a myocardial infarction (MI). Pregnant women, or women who have given birth. People who have a long-term (chronic) illness. Anyone who has been diagnosed with another type of a mental health disorder. Anyone who has symptoms that could show depression. What do my results mean? Your health care provider will review the results of your depression screening, physical exam, and lab tests. Positive screens suggest that you may have depression. Screening is the first step in getting the care that you may need. It is up to you to get your screening results. Ask your health care provider, or the department that is doing your screening tests, when your results will beready. Talk with your health care provider about your results and diagnosis. A diagnosis of depression is made using the Diagnostic and Statistical Manual of Mental Disorders (DSM-V). This is a book that lists the number and type of symptoms that mustbe present for a health care provider to give a specific diagnosis.  Your health care provider may work with you to treat your symptoms of depression, or your health care  provider may help you find a mental healthprovider who can assess, diagnose, and treat your depression. Get help right away if: You have thoughts about hurting yourself or others. If you ever feel like you may hurt yourself or others, or have thoughts about taking your own life, get help right away. You  can go to your nearest emergency department or call: Your local emergency services (911 in the [place] A suicide crisis helpline, such as the National Suicide Prevention Lifeline at (712) 527-6054. This is open 24 hours a day. Summary Depression screening is the first step in getting the help that you may need. If your screening test shows symptoms of depression (is positive), your health care provider may ask you to see a mental health provider. Anyone who is age 27 or older should be screened for depression. This information is not intended to replace advice given to you by your health care provider. Make sure you discuss any questions you have with your healthcare provider. Document Revised: 10/29/2020 Document Reviewed: 05/08/2020 Elsevier Patient Education  2022 [employer].

## 2021-07-23 NOTE — Progress Notes (Signed)
[facility]     [name] is a 45 y.o. female who presents for an follow up evaluation of Type 2 diabetes mellitus.  Current diabetic medications include none.  The patient was initially diagnosed with Prediabetes mellitus based on the following criteria:  5.9. She also has been under a lot of stress from her son. Stop losartan because drop Bp. Current monitoring regimen: none Home blood sugar records:  none Any episodes of hypoglycemia? no  Known diabetic complications: none Cardiovascular risk factors: smoking/ tobacco exposure Eye exam current (within one year): yes Weight trend: weight  Prior visit with CDE: yes -  Current diet: in general, a "healthy" diet   Current exercise: walking Medication Compliance?  yes   Is She on ACE inhibitor or angiotensin II receptor blocker?  No   The following portions of the patient's history were reviewed and updated as appropriate: allergies, current medications, past family history, past medical history, past social history, past surgical history, and problem list.  Review of Systems  Musculoskeletal:        Right ankle pain dx of Achilles tendinitis of right lower extremity  Psychiatric/Behavioral:  Positive for depression. The patient is nervous/anxious.   All other systems reviewed and are negative.  Objective:    BP 121/87 (BP Location: Right Arm, Patient Position: Sitting, Cuff Size: Large)   Pulse 87   Temp (!) 97.5 F (36.4 C) (Temporal)   Ht 5\' 4"  (1.626 m)   Wt 161 lb 6.4 oz (73.2 kg)   LMP 07/05/2021 (Exact Date)   SpO2 99%   BMI 27.70 kg/m   Physical Exam Vitals reviewed.  Constitutional:      Appearance: Normal appearance.  HENT:     Head: Normocephalic.     Right Ear: Tympanic membrane normal.     Left Ear: Tympanic membrane normal.     Nose: Nose normal.  Eyes:     Extraocular Movements: Extraocular movements intact.     Pupils: Pupils are equal, round, and reactive to light.   Cardiovascular:     Rate and Rhythm: Normal rate and regular rhythm.  Pulmonary:     Effort: Pulmonary effort is normal.     Breath sounds: Normal breath sounds.  Abdominal:     General: Bowel sounds are normal.     Palpations: Abdomen is soft.  Musculoskeletal:        General: Normal range of motion.     Cervical back: Normal range of motion and neck supple.  Skin:    General: Skin is warm and dry.  Neurological:     Mental Status: She is alert and oriented to person, place, and time.  Psychiatric:        Mood and Affect: Mood normal.        Behavior: Behavior normal.        Thought Content: Thought content normal.        Judgment: Judgment normal.     Lab Review Glucose (mg/dL)  Date Value  09/04/2021 113 (H)  05/18/2017 150 (H)   Glucose, Bld (mg/dL)  Date Value  05/20/2017 162 (H)  08/16/2016 155 (H)  11/12/2015 171 (H)   CO2 (mmol/L)  Date Value  12/30/2020 22  05/18/2017 23  04/10/2017 24   BUN (mg/dL)  Date Value  04/12/2017 8  05/18/2017 9  04/10/2017 7  08/16/2016 9  11/12/2015 12   Creat (mg/dL)  Date Value  11/14/2015 0.71   Creatinine, Ser (mg/dL)  Date Value  12/30/2020 0.73  05/18/2017 0.82  04/10/2017 0.78      Assessment:    Diabetes Mellitus type II, under good control.   Eileen Bowen was seen today for blood pressure check.  Need for Tdap vaccination -     Tdap vaccine greater than or equal to 7yo IM  Need for prophylactic vaccination against Streptococcus pneumoniae (pneumococcus) -     Pneumococcal conjugate vaccine 20-valent (Prevnar 20)  Prediabetes -     HgB A1c 5.9   1.  Rx changes: none  2.  Education: Reviewed 'ABCs' of diabetes management (respective goals in parentheses):  A1C (<7), blood pressure (<130/80), and cholesterol (LDL <100). Counseled Patient on the risk factors of co- morbidities with uncontrol diabetes  Complications -diabetic retinopathy, (close your eyes ? What do you see nothing) nephropathy  decrease in kidney function- can lead to dialysis-on a machine 3 days a week to filter your kidney, neuropathy- numbness and tinging in your hands and feet,  increase risk of heart attack and stroke, and amputation due to decrease wound healing and circulation. Decrease your risk by taking medication daily as prescribed, monitor carbohydrates- foods that are high in carbohydrates are the following rice, potatoes, breads, sugars, and pastas.  Reduction in the intake (eating) will assist in lowering your blood sugars. Exercise daily at least 30 minutes daily.   3. CHO counting diet discussed.  Reviewed CHO amount in various foods and how to read nutrition labels.  Discussed recommended serving sizes.   4.  Recommend check BG 0  times a day  5. Recommended increase physical activity - goal is 150 minutes per week  Eileen Bowen was seen today for blood pressure check.  Achilles tendinitis of right lower extremity -     Ambulatory referral to Orthopedic Surgery  Depression with anxiety GAD Started on Buspirone 5 mg BID Refer to psychiatrics    BMI 27.0-27.9,adult Overweight loosing weight to stress starting to increase exercise to help with stress and weight   Tobacco dependence - I have recommended complete cessation of tobacco use. I have discussed various options available for assistance with tobacco cessation including over the counter methods (Nicotine gum, patch and lozenges). We also discussed prescription options (Chantix, Nicotine Inhaler / Nasal Spray). The patient is not interested in pursuing any prescription tobacco cessation options at this time. - Patient declines at this time.   This note has been created with Education officer, environmental. Any transcriptional errors are unintentional.   [name], NP 07/23/2021, 8:40 AM

## 2021-07-24 LAB — CMP14+EGFR
ALT: 9 IU/L (ref 0–32)
AST: 20 IU/L (ref 0–40)
Albumin/Globulin Ratio: 1.3 (ref 1.2–2.2)
Albumin: 4.3 g/dL (ref 3.8–4.8)
Alkaline Phosphatase: 95 IU/L (ref 44–121)
BUN/Creatinine Ratio: 15 (ref 9–23)
BUN: 11 mg/dL (ref 6–24)
Bilirubin Total: 0.3 mg/dL (ref 0.0–1.2)
CO2: 19 mmol/L — ABNORMAL LOW (ref 20–29)
Calcium: 9.2 mg/dL (ref 8.7–10.2)
Chloride: 101 mmol/L (ref 96–106)
Creatinine, Ser: 0.72 mg/dL (ref 0.57–1.00)
Globulin, Total: 3.3 g/dL (ref 1.5–4.5)
Glucose: 103 mg/dL — ABNORMAL HIGH (ref 65–99)
Potassium: 4.5 mmol/L (ref 3.5–5.2)
Sodium: 137 mmol/L (ref 134–144)
Total Protein: 7.6 g/dL (ref 6.0–8.5)
eGFR: 104 mL/min/{1.73_m2} (ref >59)

## 2021-07-24 LAB — LIPID PANEL
Chol/HDL Ratio: 5.4 ratio — ABNORMAL HIGH (ref 0.0–4.4)
Cholesterol, Total: 173 mg/dL (ref 100–199)
HDL: 32 mg/dL — ABNORMAL LOW (ref >39)
LDL Chol Calc (NIH): 125 mg/dL — ABNORMAL HIGH (ref 0–99)
Triglycerides: 87 mg/dL (ref 0–149)
VLDL Cholesterol Cal: 16 mg/dL (ref 5–40)

## 2021-07-24 LAB — CBC WITH DIFFERENTIAL/PLATELET
Basophils Absolute: 0.1 10*3/uL (ref 0.0–0.2)
Basos: 1 %
EOS (ABSOLUTE): 0.8 10*3/uL — ABNORMAL HIGH (ref 0.0–0.4)
Eos: 10 %
Hematocrit: 39.5 % (ref 34.0–46.6)
Hemoglobin: 13.4 g/dL (ref 11.1–15.9)
Immature Grans (Abs): 0 10*3/uL (ref 0.0–0.1)
Immature Granulocytes: 0 %
Lymphocytes Absolute: 1.8 10*3/uL (ref 0.7–3.1)
Lymphs: 22 %
MCH: 33.2 pg — ABNORMAL HIGH (ref 26.6–33.0)
MCHC: 33.9 g/dL (ref 31.5–35.7)
MCV: 98 fL — ABNORMAL HIGH (ref 79–97)
Monocytes Absolute: 0.8 10*3/uL (ref 0.1–0.9)
Monocytes: 10 %
Neutrophils Absolute: 4.4 10*3/uL (ref 1.4–7.0)
Neutrophils: 57 %
Platelets: 376 10*3/uL (ref 150–450)
RBC: 4.04 x10E6/uL (ref 3.77–5.28)
RDW: 12.1 % (ref 11.7–15.4)
WBC: 7.8 10*3/uL (ref 3.4–10.8)

## 2021-08-06 ENCOUNTER — Ambulatory Visit (INDEPENDENT_AMBULATORY_CARE_PROVIDER_SITE_OTHER): Payer: Managed Care, Other (non HMO)

## 2021-08-06 ENCOUNTER — Encounter: Payer: Self-pay | Admitting: Orthopaedic Surgery

## 2021-08-06 ENCOUNTER — Other Ambulatory Visit: Payer: Self-pay

## 2021-08-06 ENCOUNTER — Ambulatory Visit (INDEPENDENT_AMBULATORY_CARE_PROVIDER_SITE_OTHER): Payer: Managed Care, Other (non HMO) | Admitting: Orthopaedic Surgery

## 2021-08-06 DIAGNOSIS — G8929 Other chronic pain: Secondary | ICD-10-CM | POA: Diagnosis not present

## 2021-08-06 DIAGNOSIS — M25571 Pain in right ankle and joints of right foot: Secondary | ICD-10-CM | POA: Diagnosis not present

## 2021-08-06 DIAGNOSIS — M79671 Pain in right foot: Secondary | ICD-10-CM

## 2021-08-06 MED ORDER — DICLOFENAC SODIUM 75 MG PO TBEC: 75.0000 mg | DELAYED_RELEASE_TABLET | Freq: Two times a day (BID) | ORAL | 2 refills | Status: AC | PRN

## 2021-08-06 NOTE — Progress Notes (Signed)
Office Visit Note   Patient: Eileen Bowen           Date of Birth: 1976/03/19           MRN: [id number] Visit Date: 08/06/2021              Requested by: [name], NP [address] [place],  [place] [address] PCP: [name], NP   Assessment & Plan: Visit Diagnoses:  1. Chronic heel pain, right   2. 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  We have discussed placing the patient in a cam walker weightbearing as tolerated.  I will call in a prescription NSAID and have recommended topical NSAIDs as well.  We will also go ahead and start her in physical therapy.  Internal referral has been made.  Follow-up with [place] in 6 weeks time for recheck.  Call with concerns or questions.  Follow-Up Instructions: Return in about 6 weeks (around 09/17/2021).   Orders:  Orders Placed This Encounter  Procedures   XR Ankle Complete Right   DG Os Calcis Right   No orders of the defined types were placed in this encounter.     Procedures: No procedures performed   Clinical Data: No additional findings.   Subjective: Chief Complaint  Patient presents with   Right Ankle - Pain    +HEEL PAIN    HPI patient is a pleasant 45 year old female who comes in today with right distal Achilles pain for the past 3 months.  She thinks this began after a barn door continually slammed the back of her ankle 1 night.  The pain she has is worse with walking.  She has associated swelling.  She is provided an ASO brace which she thinks made things worse.  She has been taking NSAIDs without significant relief.  Review of Systems as detailed in HPI.  All others June are negative.   Objective: Vital Signs: There were no vitals taken for this visit.  Physical Exam well-developed well-nourished female no acute distress.  Alert and oriented x3.  Ortho Exam right ankle exam shows moderate tenderness  along the distal Achilles.  Moderate tenderness to the previous bursa.  Dorsiflexion to neutral.  She is neurovascular intact distally.  Specialty Comments:  No specialty comments available.  Imaging: XR Ankle Complete Right  Result Date: 08/06/2021 X-rays demonstrate osteophyte formation to the Achilles tendon attachment at the heel.  There also appears to be calcifications within the distal Achilles tendon.    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   Type 2 diabetes mellitus with complication, without long-term current use of insulin (HCC)    Hypertension    Hypercholesteremia    Fibromyalgia    Migraine    Anxiety    Depression    Past Medical History:  Diagnosis Date   Anxiety    Bipolar 1 disorder (HCC)    Depression    Diabetes mellitus    Dysmenorrhea    Fibromyalgia    Genital warts    Hypercholesteremia    Hypertension    Migraine    Raynaud's disease     Family History  Problem Relation Age of Onset   Diabetes Mother    Fibromyalgia Sister  Hypertension Maternal Aunt    Thyroid disease Maternal Grandmother    Asthma Son     Past Surgical History:  Procedure Laterality Date   CESAREAN SECTION     CHOLECYSTECTOMY  09/1999   COLPOSCOPY     TUBAL LIGATION     Social History   Occupational History   Not on file  Tobacco Use   Smoking status: Every Day    Packs/day: 0.50    Years: 21.00    Pack years: 10.50    Types: Cigarettes   Smokeless tobacco: Never  Vaping Use   Vaping Use: Never used  Substance and Sexual Activity   Alcohol use: Yes    Alcohol/week: 1.0 standard drink    Types: 1 Standard drinks or equivalent per week   Drug use: No   Sexual activity: Yes    Partners: Male    Birth control/protection: Surgical
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
## 2021-09-07 ENCOUNTER — Ambulatory Visit (INDEPENDENT_AMBULATORY_CARE_PROVIDER_SITE_OTHER): Payer: Self-pay | Admitting: Primary Care

## 2021-09-07 ENCOUNTER — Other Ambulatory Visit: Payer: Self-pay

## 2021-09-07 ENCOUNTER — Encounter (INDEPENDENT_AMBULATORY_CARE_PROVIDER_SITE_OTHER): Payer: Self-pay | Admitting: Primary Care

## 2021-09-07 ENCOUNTER — Telehealth (INDEPENDENT_AMBULATORY_CARE_PROVIDER_SITE_OTHER): Payer: Self-pay | Admitting: *Deleted

## 2021-09-07 VITALS — BP 123/85 | HR 89 | Temp 97.3°F | Ht 64.0 in | Wt 167.0 lb

## 2021-09-07 DIAGNOSIS — F17210 Nicotine dependence, cigarettes, uncomplicated: Secondary | ICD-10-CM

## 2021-09-07 DIAGNOSIS — F17200 Nicotine dependence, unspecified, uncomplicated: Secondary | ICD-10-CM

## 2021-09-07 DIAGNOSIS — E7800 Pure hypercholesterolemia, unspecified: Secondary | ICD-10-CM

## 2021-09-07 DIAGNOSIS — I7300 Raynaud's syndrome without gangrene: Secondary | ICD-10-CM

## 2021-09-07 DIAGNOSIS — I10 Essential (primary) hypertension: Secondary | ICD-10-CM

## 2021-09-07 DIAGNOSIS — F418 Other specified anxiety disorders: Secondary | ICD-10-CM

## 2021-09-07 MED ORDER — LOSARTAN POTASSIUM 25 MG PO TABS: 25.0000 mg | ORAL_TABLET | Freq: Every day | ORAL | 0 refills | Status: DC

## 2021-09-07 MED ORDER — PRAVASTATIN SODIUM 40 MG PO TABS: 40.0000 mg | ORAL_TABLET | Freq: Every day | ORAL | 3 refills | Status: DC

## 2021-09-07 MED ORDER — PRAVASTATIN SODIUM 20 MG PO TABS
20.0000 mg | ORAL_TABLET | Freq: Every day | ORAL | 1 refills | Status: AC
Start: 2021-09-07 — End: ?

## 2021-09-07 MED ORDER — BUPROPION HCL ER (SR) 150 MG PO TB12: 150.0000 mg | ORAL_TABLET | Freq: Two times a day (BID) | ORAL | 1 refills | Status: DC

## 2021-09-07 MED ORDER — BUSPIRONE HCL 7.5 MG PO TABS
7.5000 mg | ORAL_TABLET | Freq: Two times a day (BID) | ORAL | 1 refills | Status: DC
Start: 2021-09-07 — End: 2022-03-30

## 2021-09-07 NOTE — Telephone Encounter (Signed)
Pharmacist with [facility], Trey Paula called to verify dosage on Pravachol 20 mg tab once daily #90 tabs with one refill. Verify with him this was the correct prescription on the patient's chart. Routing to office for clarification.

## 2021-09-07 NOTE — Progress Notes (Signed)
[facility]  Eileen Bowen, is a 45 y.o. female  BPZ:[id number]  EUM:[id number]  DOB - Aug 15, 1976  Chief Complaint  Patient presents with   Follow-up    medication       Subjective:   Eileen Bowen is a over weight 45 y.o. female here today for a follow up visit. Patient has No headache, No chest pain, No abdominal pain - No Nausea, No new weakness tingling or numbness, No Cough - SOB.  Recently started on BuSpar 5 mg twice daily for anxiety.  She has noticed little effect.  Previously on Lamictal and Latuda these medicines made her unable to function-effects were crying all day sleeping all day or sleeping and made her more depressed.  No problems updated.  ALLERGIES: Allergies  Allergen Reactions   Cashew Nut Oil     PAST MEDICAL HISTORY: Past Medical History:  Diagnosis Date   Anxiety    Bipolar 1 disorder (HCC)    Depression    Diabetes mellitus    Dysmenorrhea    Fibromyalgia    Genital warts    Hypercholesteremia    Hypertension    Migraine    Raynaud's disease     MEDICATIONS AT HOME: Prior to Admission medications   Medication Sig Start Date End Date Taking? Authorizing Provider  amLODipine (NORVASC) 10 MG tablet Take 1 tablet (10 mg total) by mouth daily. 12/30/20  Yes [name], NP  busPIRone (BUSPAR) 5 MG tablet Take 1 tablet (5 mg total) by mouth 2 (two) times daily. 07/23/21  Yes [name], NP  diclofenac (VOLTAREN) 75 MG EC tablet Take 1 tablet (75 mg total) by mouth 2 (two) times daily as needed. 08/06/21  Yes Cristie Hem, PA-C  losartan (COZAAR) 25 MG tablet Take 2 tablets (50 mg total) by mouth daily. 07/01/21  Yes [name], NP    Objective:   Vitals:   09/07/21 1014  BP: 123/85  Pulse: 89  Temp: (!) 97.3 F (36.3 C)  TempSrc: Temporal  SpO2: 100%  Weight: 167 lb (75.8 kg)  Height: 5\' 4"  (1.626 m)   Exam General appearance : Awake, alert, not in any distress. Speech Clear. Not toxic  looking HEENT: Atraumatic and Normocephalic, pupils equally reactive to light and accomodation Neck: Supple, no JVD. No cervical lymphadenopathy.  Chest: Good air entry bilaterally, no added sounds  CVS: S1 S2 regular, no murmurs.  Abdomen: Bowel sounds present, Non tender and not distended with no gaurding, rigidity or rebound. Extremities: B/L Lower Ext shows no edema, both legs are warm to touch Neurology: Awake alert, and oriented X 3,  Non focal Skin: No Rash  Data Review Lab Results  Component Value Date   HGBA1C 5.7 (A) 07/23/2021   HGBA1C 5.9 (A) 12/30/2020   HGBA1C 6.6 09/06/2017    Assessment & Plan  Eileen Bowen was seen today for follow-up.  Diagnoses and all orders for this visit:  Depression with anxiety She denies harm to herself or others.  Somewhat more calmer but expected more from the medication.  Agreed to increase BuSpar to 7.5 twice a day.  She currently has BuSpar 5 mg twice daily.  She will take 1 1/2 of the 5 mg twice a day until gone.  The correct dose will be at the pharmacy for refill.  Essential hypertension Blood pressure is well controlled she has altered her medication slightly taking amlodipine 10 mg and losartan 25 mg once daily.  We will continue  this regiment and monitor blood pressure closely. Counseled on blood pressure goal of less than 130/80, low-sodium, DASH diet, medication compliance, 150 minutes of moderate intensity exercise per week. Discussed medication compliance, adverse effects.   There are no diagnoses linked to this encounter.  Tobacco dependence - I have recommended complete cessation of tobacco use. I have discussed various options available for assistance with tobacco cessation including over the counter methods (Nicotine gum, patch and lozenges). We also discussed prescription options Well. Wellbutrin XR 150 bid. The patient is interested in pursuing a prescription for tobacco cessation options at this time.  Patient have been  counseled extensively about nutrition and exercise. Other issues discussed during this visit include: low cholesterol diet, weight control and daily exercise, foot care, annual eye examinations at Ophthalmology, importance of adherence with medications and regular follow-up. We also discussed long term complications of uncontrolled diabetes and hypertension.   Return in about 2 months (around 11/07/2021) for medication adjustment f/u .  The patient was given clear instructions to go to ER or return to medical center if symptoms don't improve, worsen or new problems develop. The patient verbalized understanding. The patient was told to call to get lab results if they haven't heard anything in the next week.   This note has been created with Education officer, environmental. Any transcriptional errors are unintentional.    [name], Np 09/07/2021, 10:42 AM

## 2021-09-07 NOTE — Patient Instructions (Addendum)
BuSpar 1 1/2 of the 5 mg twice a day until gone.  Then she will pick up BuSpar 7.5 take 1 twice a day.  Bupropion Extended-Release Tablets (Depression/Mood Disorders) What is this medication? BUPROPION (byoo PROE pee on) treats depression. It increases norepinephrine and dopamine in the brain, hormones that help regulate mood. It belongs to a group of medications called NDRIs. This medicine may be used for other purposes; ask your health care provider or pharmacist if you have questions. COMMON BRAND NAME(S): Aplenzin, Budeprion XL, Forfivo XL, Wellbutrin XL What should I tell my care team before I take this medication? They need to know if you have any of these conditions: An eating disorder, such as anorexia or bulimia Bipolar disorder or psychosis Diabetes or high blood sugar, treated with medication Glaucoma Head injury or brain tumor Heart disease, previous heart attack, or irregular heart beat High blood pressure Kidney or liver disease Seizures (convulsions) Suicidal thoughts or a previous suicide attempt Tourette's syndrome Weight loss An unusual or allergic reaction to bupropion, other medications, foods, dyes, or preservatives Pregnant or trying to become pregnant Breast-feeding How should I use this medication? Take this medication by mouth with a glass of water. Follow the directions on the prescription label. You can take it with or without food. If it upsets your stomach, take it with food. Do not crush, chew, or cut these tablets. This medication is taken once daily at the same time each day. Do not take your medication more often than directed. Do not stop taking this medication suddenly except upon the advice of your care team. Stopping this medication too quickly may cause serious side effects or your condition may worsen. A special MedGuide will be given to you by the pharmacist with each prescription and refill. Be sure to read this information carefully each time. Talk  to your care team about the use of this medication in children. Special care may be needed. Overdosage: If you think you have taken too much of this medicine contact a poison control center or emergency room at once. NOTE: This medicine is only for you. Do not share this medicine with others. What if I miss a dose? If you miss a dose, skip the missed dose and take your next tablet at the regular time. Do not take double or extra doses. What may interact with this medication? Do not take this medication with any of the following: Linezolid MAOIs like Azilect, Carbex, Eldepryl, Marplan, Nardil, and Parnate Methylene blue (injected into a vein) Other medications that contain bupropion like Zyban This medication may also interact with the following: Alcohol Certain medications for anxiety or sleep Certain medications for blood pressure like metoprolol, propranolol Certain medications for depression or psychotic disturbances Certain medications for HIV or AIDS like efavirenz, lopinavir, nelfinavir, ritonavir Certain medications for irregular heart beat like propafenone, flecainide Certain medications for Parkinson's disease like amantadine, levodopa Certain medications for seizures like carbamazepine, phenytoin, phenobarbital Cimetidine Clopidogrel Cyclophosphamide Digoxin Furazolidone Isoniazid Nicotine Orphenadrine Procarbazine Steroid medications like prednisone or cortisone Stimulant medications for attention disorders, weight loss, or to stay awake Tamoxifen Theophylline Thiotepa Ticlopidine Tramadol Warfarin This list may not describe all possible interactions. Give your health care provider a list of all the medicines, herbs, non-prescription drugs, or dietary supplements you use. Also tell them if you smoke, drink alcohol, or use illegal drugs. Some items may interact with your medicine. What should I watch for while using this medication? Tell your care team if your  symptoms do not get better or if they get worse. Visit your care team for regular checks on your progress. Because it may take several weeks to see the full effects of this medication, it is important to continue your treatment as prescribed. Watch for new or worsening thoughts of suicide or depression. This includes sudden changes in mood, behavior, or thoughts. These changes can happen at any time but are more common in the beginning of treatment or after a change in dose. Call your care team right away if you experience these thoughts or worsening depression. Manic episodes may happen in patients with bipolar disorder who take this medication. Watch for changes in feelings or behaviors such as feeling anxious, nervous, agitated, panicky, irritable, hostile, aggressive, impulsive, severely restless, overly excited and hyperactive, or trouble sleeping. These symptoms can happen at anytime but are more common in the beginning of treatment or after a change in dose. Call your care team right away if you notice any of these symptoms. This medication may cause serious skin reactions. They can happen weeks to months after starting the medication. Contact your care team right away if you notice fevers or flu-like symptoms with a rash. The rash may be red or purple and then turn into blisters or peeling of the skin. Or, you might notice a red rash with swelling of the face, lips or lymph nodes in your neck or under your arms. Avoid drinks that contain alcohol while taking this medication. Drinking large amounts of alcohol, using sleeping or anxiety medications, or quickly stopping the use of these agents while taking this medication may increase your risk for a seizure. Do not drive or use heavy machinery until you know how this medication affects you. This medication can impair your ability to perform these tasks. Do not take this medication close to bedtime. It may prevent you from sleeping. Your mouth may get  dry. Chewing sugarless gum or sucking hard candy, and drinking plenty of water may help. Contact your care team if the problem does not go away or is severe. The tablet shell for some brands of this medication does not dissolve. This is normal. The tablet shell may appear whole in the stool. This is not a cause for concern. What side effects may I notice from receiving this medication? Side effects that you should report to your care team as soon as possible: Allergic reactions-skin rash, itching, hives, swelling of the face, lips, tongue, or throat Increase in blood pressure Mood and behavior changes-anxiety, nervousness, confusion, hallucinations, irritability, hostility, thoughts of suicide or self-harm, worsening mood, feelings of depression Redness, blistering, peeling, or loosening of the skin, including inside the mouth Seizures Sudden eye pain or change in vision such as blurry vision, seeing halos around lights, vision loss Side effects that usually do not require medical attention (report to your care team if they continue or are bothersome): Constipation Dizziness Dry mouth Loss of appetite Nausea Tremors or shaking Trouble sleeping This list may not describe all possible side effects. Call your doctor for medical advice about side effects. You may report side effects to FDA at 1-800-FDA-1088. Where should I keep my medication? Keep out of the reach of children and pets. Store at room temperature between 15 and 30 degrees C (59 and 86 degrees F). Throw away any unused medication after the expiration date. NOTE: This sheet is a summary. It may not cover all possible information. If you have questions about this medicine, talk to your doctor,  pharmacist, or health care provider.  2022 Elsevier/Gold Standard (2021-01-27 14:27:06)  Your cholesterol is still high, but continued recommendations to make lifestyle changes. Your LDL is above normal.  The LDL is the bad cholesterol.  Over  time and in combination with inflammation and other factors, this contributes to plaque which in turn may lead to stroke and/or heart attack down the road.  Sometimes high LDL is primarily genetic, and people might be eating all the right foods but still have high numbers.  Other times, there is room for improvement in one's diet and eating healthier can bring this number down and potentially reduce one's risk of heart attack and/or stroke.  To reduce your LDL, Remember - more fruits and vegetables, more fish, and limit red meat and dairy products.  More soy, nuts, beans, barley, lentils, oats and plant sterol ester enriched margarine instead of butter.  I also encourage eliminating sugar and processed food.  Remember, shop on the outside of the grocery store and visit your [employer].   If you would like to talk with me about dietary changes plus or minus medications for your cholesterol, please let me know. We should recheck your cholesterol in 3-6 months.

## 2021-09-07 NOTE — Progress Notes (Signed)
[facility]     HPI [name] is a 45 y.o. overweight female presents for medication for anxiety.  Recently prescribed BuSpar 5 mg twice daily with little to some effect.  She was previously on Jordan and Lamictal on these medications she was not able to function.Depression increased and she stayed in the bed crying or sleeping all day.  She does have interest in smoking cessation  Blood pressure today is controlled-denies shortness of breath, headaches, chest pain or lower extremity edema. Increase pain and numbness in fingers especially with weather changes (red/white/blue) Past Medical History:  Diagnosis Date   Anxiety    Bipolar 1 disorder (HCC)    Depression    Diabetes mellitus    Dysmenorrhea    Fibromyalgia    Genital warts    Hypercholesteremia    Hypertension    Migraine    Raynaud's disease      Allergies  Allergen Reactions   Cashew Nut Oil       Current Outpatient Medications on File Prior to Visit  Medication Sig Dispense Refill   amLODipine (NORVASC) 10 MG tablet Take 1 tablet (10 mg total) by mouth daily. 90 tablet 3   diclofenac (VOLTAREN) 75 MG EC tablet Take 1 tablet (75 mg total) by mouth 2 (two) times daily as needed. 60 tablet 2   No current facility-administered medications on file prior to visit.    ROS: all negative except above.   Physical Exam: Filed Weights   09/07/21 1014  Weight: 167 lb (75.8 kg)   BP 123/85 (BP Location: Right Arm, Patient Position: Sitting, Cuff Size: Normal)   Pulse 89   Temp (!) 97.3 F (36.3 C) (Temporal)   Ht 5\' 4"  (1.626 m)   Wt 167 lb (75.8 kg)   LMP 08/30/2021 (Exact Date)   SpO2 100%   BMI 28.67 kg/m  General Appearance: Well nourished, in no apparent distress. Eyes: PERRLA, EOMs, conjunctiva no swelling or erythema Sinuses: No Frontal/maxillary tenderness ENT/Mouth: Ext aud canals clear, TMs without erythema, bulging. No erythema, swelling, or exudate on post pharynx.   Tonsils not swollen or erythematous. Hearing normal.  Neck: Supple, goiter Respiratory: Respiratory effort normal, BS equal bilaterally without rales, rhonchi, wheezing or stridor.  Cardio: RRR with no MRGs. Brisk peripheral pulses without edema.  Abdomen: Soft, + BS.  Non tender, no guarding, rebound, hernias, masses. Lymphatics: Non tender without lymphadenopathy.  Musculoskeletal: Full ROM, 5/5 strength, normal gait.  Skin: Warm, dry without rashes, lesions, ecchymosis.  Neuro: Cranial nerves intact. Normal muscle tone, no cerebellar symptoms. Sensation intact.  Psych: Awake and oriented X 3, normal affect, Insight and Judgment appropriate.     Eileen Bowen was seen today for follow-up.  Diagnoses and all orders for this visit:  Depression with anxiety Increase BuSpar to 7.5 twice daily.  She has 5 mg at home at this time.  She has been instructed to take 1-1/2 tablets twice a day until gone.  Refill prescription will be correct BuSpar 7.5 take 1 daily also instructions added to AVS. -     busPIRone (BUSPAR) 7.5 MG tablet; Take 1 tablet (7.5 mg total) by mouth 2 (two) times daily.  Essential hypertension Blood pressure is well controlled despite her changes in prescription she takes amlodipine 10 mg and 1 losartan 25 mg once daily.  She will continue with this regimen and monitor blood pressure.  She is monitoring her sodium intake (Dash diet) and exercising walking daily -  losartan (COZAAR) 25 MG tablet; Take 1 tablet (25 mg total) by mouth daily.  Tobacco dependence - I have recommended complete cessation of tobacco use. I have discussed various options available for assistance with tobacco cessation including over the counter methods (Nicotine gum, patch and lozenges). We also discussed prescription options .  Wellbutrin 150 mg XR twice a day dual purpose anxiety depression and smoking sensation.  T  Hypercholesteremia LDL is elevated on last lab review added pravastatin 20 mg daily  and decrease fatty fried foods.  More information is provided on AVS-      pravastatin (PRAVACHOL)  20 daily MG tablet; Take 1 tablet by mouth daily.   Raynaud's phenomenon without gangrene She has a history of Raynaud's disease red-white and blue fingers will intolerance to cold and numbness.  Advised to apply for financial assistance therefore I can refer her out to a specialist. Reviewed up to date recommended CCB , Prozac , losartan and smoking cessation. Other medication will need to be prescribe outside of practice. Will consider changed Buspar to Prozac depending on if no improvement  Eileen Sessions, NP 10:46 AM

## 2021-09-07 NOTE — Telephone Encounter (Signed)
Please contact pharmacy and clarify.

## 2021-09-17 ENCOUNTER — Ambulatory Visit: Payer: Managed Care, Other (non HMO) | Admitting: Orthopaedic Surgery

## 2021-11-12 ENCOUNTER — Ambulatory Visit (INDEPENDENT_AMBULATORY_CARE_PROVIDER_SITE_OTHER): Payer: Medicaid Other | Admitting: Primary Care

## 2022-03-30 ENCOUNTER — Ambulatory Visit
Admission: EM | Admit: 2022-03-30 | Discharge: 2022-03-30 | Disposition: A | Discharge status: Home / Self Care | Payer: Medicaid Other | Attending: Emergency Medicine | Admitting: Emergency Medicine

## 2022-03-30 DIAGNOSIS — K2100 Gastro-esophageal reflux disease with esophagitis, without bleeding: Secondary | ICD-10-CM

## 2022-03-30 MED ORDER — FAMOTIDINE 40 MG PO TABS: 40.0000 mg | ORAL_TABLET | Freq: Every day | ORAL | 5 refills | Status: DC

## 2022-03-30 MED ORDER — LANSOPRAZOLE 30 MG PO CPDR: 30.0000 mg | DELAYED_RELEASE_CAPSULE | Freq: Two times a day (BID) | ORAL | 5 refills | Status: AC

## 2022-03-30 NOTE — ED Provider Notes (Signed)
?[facility] ? ? ? ?CSN: [id number] ?Arrival date & time: 03/30/22  1342 ?  ? ?HISTORY  ? ?Chief Complaint  ?Patient presents with  ? Chest Pain  ? ?HPI ?Eileen Bowen is a 46 y.o. female. Pt c/o centralized & left chest pain (constant & dull), numbness to her left arm, neck pain, dizziness and  lightheadedness that began last Thursday.  Patient reports a history of GERD, currently taking Prilosec 20 mg which she has taken for many years and feels like is not working anymore.  Patient states her history is a history of H. pylori.  Patient denies hematemesis. ? ?The history is provided by the patient.  ?Past Medical History:  ?Diagnosis Date  ? Anxiety   ? Bipolar 1 disorder (HCC)   ? Depression   ? Diabetes mellitus   ? Dysmenorrhea   ? Fibromyalgia   ? Genital warts   ? Hypercholesteremia   ? Hypertension   ? Migraine   ? Raynaud's disease   ? ?Patient Active Problem List  ? Diagnosis Date Noted  ? Raynaud's phenomenon 10/05/2017  ? GAD (generalized anxiety disorder) 04/11/2017  ? 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 Type 2 diabetes mellitus with complication, without long-term current use of insulin (HCC)   ? Hypertension   ? Hypercholesteremia   ? Fibromyalgia   ? Migraine   ? Anxiety   ? Depression   ? ?Past Surgical History:  ?Procedure Laterality Date  ? CESAREAN SECTION    ? CHOLECYSTECTOMY  09/1999  ? COLPOSCOPY    ? TUBAL LIGATION    ? ?OB History   ? ? Gravida  ?2  ? Para  ?2  ? Term  ?2  ? Preterm  ?   ? AB  ?   ? Living  ?2  ?  ? ? SAB  ?   ? IAB  ?   ? Ectopic  ?   ? Multiple  ?   ? Live Births  ?2  ?   ?  ?  ? ?Home Medications   ? ?Prior to Admission medications   ?Medication Sig Start Date End Date Taking? Authorizing Provider  ?famotidine (PEPCID) 40 MG tablet Take 1 tablet (40 mg total) by mouth daily. 03/30/22 09/26/22 Yes Theadora Rama Scales, PA-C  ?lansoprazole (PREVACID) 30 MG capsule Take 1 capsule (30 mg total) by mouth 2 (two) times  daily before a meal. 03/30/22 09/26/22 Yes Theadora Rama Scales, PA-C  ?diclofenac (VOLTAREN) 75 MG EC tablet Take 1 tablet (75 mg total) by mouth 2 (two) times daily as needed. 08/06/21   Cristie Hem, PA-C  ?losartan (COZAAR) 25 MG tablet Take 1 tablet (25 mg total) by mouth daily. 09/07/21   [name], NP  ?pravastatin (PRAVACHOL) 20 MG tablet Take 1 tablet (20 mg total) by mouth daily. 09/07/21   [name], NP  ? ? ?Family History ?Family History  ?Problem Relation Age of Onset  ? Diabetes Mother   ? Fibromyalgia Sister   ? Hypertension Maternal Aunt   ? Thyroid disease Maternal Grandmother   ? Asthma Son   ? ?Social History ?Social History  ? ?Tobacco Use  ? Smoking status: Every Day  ?  Packs/day: 0.50  ?  Years: 21.00  ?  Pack years: 10.50  ?  Types: Cigarettes  ? Smokeless tobacco: Never  ?Vaping Use  ? Vaping Use: Never used  ?Substance Use Topics  ? Alcohol  use: Yes  ?  Alcohol/week: 1.0 standard drink  ?  Types: 1 Standard drinks or equivalent per week  ? Drug use: No  ? ?Allergies   ?Cashew nut oil ? ?Review of Systems ?Review of Systems ?Pertinent findings noted in history of present illness.  ? ?Physical Exam ?Triage Vital Signs ?ED Triage Vitals  ?Enc Vitals Group  ?   BP 09/25/21 0827 (!) 147/82  ?   Pulse Rate 09/25/21 0827 72  ?   Resp 09/25/21 0827 18  ?   Temp 09/25/21 0827 98.3 ?F (36.8 ?C)  ?   Temp Source 09/25/21 0827 Oral  ?   SpO2 09/25/21 0827 98 %  ?   Weight --   ?   Height --   ?   Head Circumference --   ?   Peak Flow --   ?   Pain Score 09/25/21 0826 5  ?   Pain Loc --   ?   Pain Edu? --   ?   Excl. in [place]? --   ?No data found. ? ?Updated Vital Signs ?BP (!) 146/94 (BP Location: Left Arm)   Pulse 89   Temp 98.3 ?F (36.8 ?C) (Oral)   Resp 18   SpO2 96%  ? ?Physical Exam ?Vitals and nursing note reviewed.  ?Constitutional:   ?   General: She is not in acute distress. ?   Appearance: Normal appearance. She is not ill-appearing.  ?HENT:  ?   Head: Normocephalic  and atraumatic.  ?Eyes:  ?   General: Lids are normal.     ?   Right eye: No discharge.     ?   Left eye: No discharge.  ?   Extraocular Movements: Extraocular movements intact.  ?   Conjunctiva/sclera: Conjunctivae normal.  ?   Right eye: Right conjunctiva is not injected.  ?   Left eye: Left conjunctiva is not injected.  ?Neck:  ?   Trachea: Trachea and phonation normal.  ?Cardiovascular:  ?   Rate and Rhythm: Normal rate and regular rhythm.  ?   Pulses: Normal pulses.  ?   Heart sounds: Normal heart sounds. No murmur heard. ?  No friction rub. No gallop.  ?Pulmonary:  ?   Effort: Pulmonary effort is normal. No accessory muscle usage, prolonged expiration or respiratory distress.  ?   Breath sounds: Normal breath sounds. No stridor, decreased air movement or transmitted upper airway sounds. No decreased breath sounds, wheezing, rhonchi or rales.  ?Chest:  ?   Chest wall: No tenderness.  ?Abdominal:  ?   General: Abdomen is flat. Bowel sounds are normal. There is no distension or abdominal bruit. There are no signs of injury.  ?   Palpations: Abdomen is soft. There is no shifting dullness, fluid wave, hepatomegaly, splenomegaly, mass or pulsatile mass.  ?   Tenderness: There is abdominal tenderness in the epigastric area. There is no right CVA tenderness, left CVA tenderness, guarding or rebound. Negative signs include Murphy's sign, Rovsing's sign, McBurney's sign, psoas sign and obturator sign.  ?   Hernia: No hernia is present.  ?Musculoskeletal:     ?   General: Normal range of motion.  ?   Cervical back: Normal range of motion and neck supple. Normal range of motion.  ?Lymphadenopathy:  ?   Cervical: No cervical adenopathy.  ?Skin: ?   General: Skin is warm and dry.  ?   Findings: No erythema or rash.  ?Neurological:  ?  General: No focal deficit present.  ?   Mental Status: She is alert and oriented to person, place, and time.  ?Psychiatric:     ?   Mood and Affect: Mood normal.     ?   Behavior: Behavior  normal.  ? ? ?Visual Acuity ?Right Eye Distance:   ?Left Eye Distance:   ?Bilateral Distance:   ? ?Right Eye Near:   ?Left Eye Near:    ?Bilateral Near:    ? ?UC Couse / Diagnostics / Procedures:  ?  ?EKG ? ?Radiology ?No results found. ? ?Procedures ?Procedures (including critical care time) ? ?UC Diagnoses / Final Clinical Impressions(s)   ?I have reviewed the triage vital signs and the nursing notes. ? ?Pertinent labs & imaging results that were available during my care of the patient were reviewed by me and considered in my medical decision making (see chart for details).   ? ?Final diagnoses:  ?Gastroesophageal reflux disease with esophagitis without hemorrhage  ? ?EKG today is normal, patient reassured.  Patient provided with a prescription for Prevacid twice daily and famotidine 40 mg daily.  Patient provided with information for local [facility] to help her get established since she does not have insurance. ? ?ED Prescriptions   ? ? Medication Sig Dispense Auth. Provider  ? lansoprazole (PREVACID) 30 MG capsule Take 1 capsule (30 mg total) by mouth 2 (two) times daily before a meal. 60 capsule Lynden Oxford Scales, PA-C  ? famotidine (PEPCID) 40 MG tablet Take 1 tablet (40 mg total) by mouth daily. 30 tablet Lynden Oxford Scales, PA-C  ? ?  ? ?PDMP not reviewed this encounter. ? ?Pending results:  ?Labs Reviewed - No data to display ? ?Medications Ordered in UC: ?Medications - No data to display ? ?Disposition Upon Discharge:  ?Condition: stable for discharge home ?Home: take medications as prescribed; routine discharge instructions as discussed; follow up as advised. ? ?Patient presented with an acute illness with associated systemic symptoms and significant discomfort requiring urgent management. In my opinion, this is a condition that a prudent lay person (someone who possesses an average knowledge of health and medicine) may potentially expect to result in complications if not addressed urgently such as  respiratory distress, impairment of bodily function or dysfunction of bodily organs.  ? ?Routine symptom specific, illness specific and/or disease specific instructions were discussed with the patient and/or careg

## 2022-03-30 NOTE — Discharge Instructions (Signed)
Please begin Prevacid twice daily.  Prevacid is a proton pump inhibitor similar to Prilosec but is more effective.  Prevacid is only taken twice daily when you are having acute symptoms so as you begin to feel better, please feel free to decrease to once daily. ? ?At bedtime I also want you to take famotidine.  I have prescribed this medication for 2 reasons.  1 is that it also helps lower the amount of stomach acid you are producing and will significantly reduce nighttime symptoms of heartburn.  The other is that is an antihistamine which is an anti-inflammatory medication and should settle some of that burning and inflammation that you are feeling at this time. ? ?Thank you for visiting urgent care today.  Please reach out to [facility] about getting an appointment as soon as possible to discuss testing for H. pylori. ?

## 2022-03-30 NOTE — ED Triage Notes (Signed)
Pt c/o centralized & left chest pain (constant & dull), numbness to her left arm, neck pain, dizziness and  lightheadedness that began last Thursday.  ? ?

## 2022-06-16 ENCOUNTER — Ambulatory Visit
Admission: EM | Admit: 2022-06-16 | Discharge: 2022-06-16 | Disposition: A | Discharge status: Home / Self Care | Payer: Self-pay | Attending: Internal Medicine | Admitting: Internal Medicine

## 2022-06-16 ENCOUNTER — Ambulatory Visit (INDEPENDENT_AMBULATORY_CARE_PROVIDER_SITE_OTHER): Payer: Self-pay

## 2022-06-16 DIAGNOSIS — M25511 Pain in right shoulder: Secondary | ICD-10-CM

## 2022-06-16 MED ORDER — PREDNISONE 20 MG PO TABS: 40.0000 mg | ORAL_TABLET | Freq: Every day | ORAL | 0 refills | Status: AC

## 2022-06-16 NOTE — ED Provider Notes (Signed)
[facility]    CSN: [id number] Arrival date & time: 06/16/22  3220      History   Chief Complaint Chief Complaint  Patient presents with   Righ arm pain    HPI Eileen Bowen is a 46 y.o. female.   Patient presents with right shoulder pain that started about a week ago.  Patient denies any apparent injury.  She reports history of similar shoulder pain in 2017 that resolved after 4 days.  She is not sure what medication she took to help alleviate this pain in the past.  She is also not sure if she saw an orthopedist for this.  Denies any numbness or tingling but does report that pain is starting to radiate down the arm.  She has taken Aleve and used Voltaren gel with minimal improvement.     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 Authorizing Provider  predniSONE (DELTASONE) 20 MG tablet Take 2 tablets (40 mg total) by mouth daily for 5 days.  06/16/22 06/21/22 Yes Danyale Ridinger, Acie Fredrickson, FNP  diclofenac (VOLTAREN) 75 MG EC tablet Take 1 tablet (75 mg total) by mouth 2 (two) times daily as needed. 08/06/21   Cristie Hem, PA-C  famotidine (PEPCID) 40 MG tablet Take 1 tablet (40 mg total) by mouth daily. 03/30/22 09/26/22  Theadora Rama Scales, PA-C  lansoprazole (PREVACID) 30 MG capsule Take 1 capsule (30 mg total) by mouth 2 (two) times daily before a meal. 03/30/22 09/26/22  Theadora Rama Scales, PA-C  losartan (COZAAR) 25 MG tablet Take 1 tablet (25 mg total) by mouth daily. 09/07/21   [name], NP  pravastatin (PRAVACHOL) 20 MG tablet Take 1 tablet (20 mg total) by mouth daily. 09/07/21   [name], NP    Family History Family History  Problem Relation Age of Onset   Diabetes Mother    Fibromyalgia Sister    Hypertension Maternal Aunt    Thyroid disease Maternal Grandmother    Asthma  Son     Social History Social History   Tobacco Use   Smoking status: Every Day    Packs/day: 0.50    Years: 21.00    Total pack years: 10.50    Types: Cigarettes   Smokeless tobacco: Never  Vaping Use   Vaping Use: Never used  Substance Use Topics   Alcohol use: Yes    Alcohol/week: 1.0 standard drink of alcohol    Types: 1 Standard drinks or equivalent per week   Drug use: No     Allergies   Cashew nut oil   Review of Systems Review of Systems Per HPI  Physical Exam Triage Vital Signs ED Triage Vitals  Enc Vitals Group     BP 06/16/22 0854 (!) 139/95     Pulse Rate 06/16/22 0854 78     Resp 06/16/22 0854 20     Temp 06/16/22 0854 98.4 F (36.9 C)     Temp Source 06/16/22 0854 Oral     SpO2 06/16/22 0854 96 %     Weight --      Height 06/16/22 0902 5\' 4"  (1.626 m)     Head Circumference --      Peak Flow --      Pain Score 06/16/22 0858 5     Pain Loc --      Pain Edu? --      Excl. in GC? --    No data found.  Updated Vital Signs BP (!) 139/95 (BP Location: Left Arm)   Pulse 78   Temp  98.4 F (36.9 C) (Oral)   Resp 20   Ht 5\' 4"  (1.626 m)   LMP 05/28/2022   SpO2 96%   BMI 28.67 kg/m   Visual Acuity Right Eye Distance:   Left Eye Distance:   Bilateral Distance:    Right Eye Near:   Left Eye Near:    Bilateral Near:     Physical Exam Constitutional:      General: She is not in acute distress.    Appearance: Normal appearance. She is not toxic-appearing or diaphoretic.  HENT:     Head: Normocephalic and atraumatic.  Eyes:     Extraocular Movements: Extraocular movements intact.     Conjunctiva/sclera: Conjunctivae normal.  Pulmonary:     Effort: Pulmonary effort is normal.  Musculoskeletal:     Comments: Tenderness to palpation to anterior and lateral shoulder.  No obvious swelling, discoloration, abrasions, lacerations noted.  Pain with abduction at 45 degrees.  Grip strength 5/5.  Neurovascular intact.  Neurological:     General: No focal deficit present.     Mental Status: She is alert and oriented to person, place, and time. Mental status is at baseline.  Psychiatric:        Mood and Affect: Mood normal.        Behavior: Behavior normal.        Thought Content: Thought content normal.        Judgment: Judgment normal.      UC Treatments / Results  Labs (all labs ordered are listed, but only abnormal results are displayed) Labs Reviewed - No data to display  EKG   Radiology DG Shoulder Right  Result Date: 06/16/2022 CLINICAL DATA:  Acute right shoulder pain. EXAM: RIGHT SHOULDER - 2+ VIEW COMPARISON:  None Available. FINDINGS: There is no evidence of fracture or dislocation. Joint spaces are intact. Calcifications seen over greater tuberosity suggesting calcific tendinosis. Soft tissues are unremarkable. IMPRESSION: Calcifications  seen over greater tuberosity suggesting calcific tendinosis. No acute abnormality seen. Electronically Signed   By: Lupita Raider M.D.   On: 06/16/2022 09:52    Procedures Procedures (including critical care  time)  Medications Ordered in UC Medications - No data to display  Initial Impression / Assessment and Plan / UC Course  I have reviewed the triage vital signs and the nursing notes.  Pertinent labs & imaging results that were available during my care of the patient were reviewed by me and considered in my medical decision making (see chart for details).     Right shoulder x-ray showing evidence of calcific tendinosis which could be causing patient's pain.  I do think patient would benefit from prednisone given that symptoms have been refractory to NSAIDs.  Given patient's history of GERD, patient was offered IM steroids but declined.  Will treat with oral steroids but patient was advised of adverse reactions that may occur.  Patient voiced understanding.  Patient has taken prednisone in the past and has tolerated well so I do think this will be safe.  There does not appear to be any obvious contraindication to prednisone in patient's chart or patient's history.  Patient was offered IM Toradol but declined.  Patient is requesting "something for pain".  Advised patient that we do not routinely prescribe narcotic pain medication at urgent care and patient stated that "she was not painseeking and did not want narcotics anyways".  Advised patient that it may be beneficial to avoid NSAIDs given history of GERD given that she was prescribed prednisone.  Patient advised that she may take Tylenol as needed for pain to supplement.  Also recommended ice application.  Patient advised to follow-up with orthopedist at provided contact information for further evaluation and management.  Patient states that "she does not have insurance so she will not be seeing an orthopedist".  Therefore, patient was advised to follow-up with primary care doctor.  Discussed return precautions.  Patient verbalized understanding and was agreeable with plan. Final Clinical Impressions(s) / UC Diagnoses   Final diagnoses:  Acute pain  of right shoulder     Discharge Instructions      You have been prescribed prednisone to decrease inflammation associated with your shoulder pain.  Recommend that you not take Aleve while taking prednisone.  Also recommend ice application.  Follow-up with orthopedist at provided contact information for further evaluation and management.     ED Prescriptions     Medication Sig Dispense Auth. Provider   predniSONE (DELTASONE) 20 MG tablet Take 2 tablets (40 mg total) by mouth daily for 5 days. 10 tablet Gustavus Bryant, [place]      PDMP not reviewed this encounter.   Gustavus Bryant, [place] 06/16/22 1115

## 2022-06-16 NOTE — ED Triage Notes (Signed)
Report L arm pain onset : 7 days ago   Pain hurt like tooth aches Pain level 5 Were like a 10 at 4am this morning Took 2 aleve at 4am but did not helped Had this before back 2017   Bipolar taking no medicine

## 2022-06-16 NOTE — Discharge Instructions (Addendum)
You have been prescribed prednisone to decrease inflammation associated with your shoulder pain.  Recommend that you not take Aleve while taking prednisone.  Also recommend ice application.  Follow-up with orthopedist at provided contact information for further evaluation and management.

## 2023-01-09 ENCOUNTER — Emergency Department (HOSPITAL_COMMUNITY)
Admission: EM | Admit: 2023-01-09 | Discharge: 2023-01-09 | Disposition: A | Discharge status: Home / Self Care | Payer: Medicaid Other | Attending: Emergency Medicine | Admitting: Emergency Medicine

## 2023-01-09 DIAGNOSIS — F17210 Nicotine dependence, cigarettes, uncomplicated: Secondary | ICD-10-CM | POA: Insufficient documentation

## 2023-01-09 DIAGNOSIS — E119 Type 2 diabetes mellitus without complications: Secondary | ICD-10-CM | POA: Insufficient documentation

## 2023-01-09 DIAGNOSIS — I10 Essential (primary) hypertension: Secondary | ICD-10-CM | POA: Insufficient documentation

## 2023-01-09 DIAGNOSIS — Z79899 Other long term (current) drug therapy: Secondary | ICD-10-CM | POA: Insufficient documentation

## 2023-01-09 DIAGNOSIS — K2950 Unspecified chronic gastritis without bleeding: Secondary | ICD-10-CM | POA: Insufficient documentation

## 2023-01-09 LAB — URINALYSIS, ROUTINE W REFLEX MICROSCOPIC
Bilirubin Urine: NEGATIVE
Glucose, UA: NEGATIVE mg/dL
Hgb urine dipstick: NEGATIVE
Ketones, ur: NEGATIVE mg/dL
Leukocytes,Ua: NEGATIVE
Nitrite: NEGATIVE
Protein, ur: NEGATIVE mg/dL
Specific Gravity, Urine: 1.008 (ref 1.005–1.030)
pH: 6 (ref 5.0–8.0)

## 2023-01-09 LAB — CBC WITH DIFFERENTIAL/PLATELET
Abs Immature Granulocytes: 0.03 10*3/uL (ref 0.00–0.07)
Basophils Absolute: 0.1 10*3/uL (ref 0.0–0.1)
Basophils Relative: 1 %
Eosinophils Absolute: 0.5 10*3/uL (ref 0.0–0.5)
Eosinophils Relative: 6 %
HCT: 39.8 % (ref 36.0–46.0)
Hemoglobin: 13.2 g/dL (ref 12.0–15.0)
Immature Granulocytes: 0 %
Lymphocytes Relative: 24 %
Lymphs Abs: 1.9 10*3/uL (ref 0.7–4.0)
MCH: 33.8 pg (ref 26.0–34.0)
MCHC: 33.2 g/dL (ref 30.0–36.0)
MCV: 102.1 fL — ABNORMAL HIGH (ref 80.0–100.0)
Monocytes Absolute: 0.8 10*3/uL (ref 0.1–1.0)
Monocytes Relative: 10 %
Neutro Abs: 4.7 10*3/uL (ref 1.7–7.7)
Neutrophils Relative %: 59 %
Platelets: 384 10*3/uL (ref 150–400)
RBC: 3.9 MIL/uL (ref 3.87–5.11)
RDW: 13.5 % (ref 11.5–15.5)
WBC: 8 10*3/uL (ref 4.0–10.5)
nRBC: 0 % (ref 0.0–0.2)

## 2023-01-09 LAB — COMPREHENSIVE METABOLIC PANEL
ALT: 14 U/L (ref 0–44)
AST: 21 U/L (ref 15–41)
Albumin: 3.6 g/dL (ref 3.5–5.0)
Alkaline Phosphatase: 70 U/L (ref 38–126)
Anion gap: 7 (ref 5–15)
BUN: 12 mg/dL (ref 6–20)
CO2: 24 mmol/L (ref 22–32)
Calcium: 9.4 mg/dL (ref 8.9–10.3)
Chloride: 105 mmol/L (ref 98–111)
Creatinine, Ser: 0.62 mg/dL (ref 0.44–1.00)
GFR, Estimated: >60 mL/min (ref >60)
Glucose, Bld: 98 mg/dL (ref 70–99)
Potassium: 4.2 mmol/L (ref 3.5–5.1)
Sodium: 136 mmol/L (ref 135–145)
Total Bilirubin: 0.4 mg/dL (ref 0.3–1.2)
Total Protein: 7.8 g/dL (ref 6.5–8.1)

## 2023-01-09 LAB — LIPASE, BLOOD: Lipase: 45 U/L (ref 11–51)

## 2023-01-09 MED ORDER — FAMOTIDINE 20 MG PO TABS: 20.0000 mg | ORAL_TABLET | Freq: Two times a day (BID) | ORAL | 0 refills | Status: AC

## 2023-01-09 MED ORDER — LOSARTAN POTASSIUM 25 MG PO TABS: 25.0000 mg | ORAL_TABLET | Freq: Every day | ORAL | 2 refills | Status: AC

## 2023-01-09 MED ORDER — PANTOPRAZOLE SODIUM 20 MG PO TBEC: 20.0000 mg | DELAYED_RELEASE_TABLET | Freq: Every day | ORAL | 0 refills | Status: AC

## 2023-01-09 MED ORDER — LIDOCAINE VISCOUS HCL 2 % MT SOLN
15.0000 mL | Freq: Once | OROMUCOSAL | Status: AC
  Administered 2023-01-09: 15 mL via ORAL
  Filled 2023-01-09: qty 15

## 2023-01-09 MED ORDER — ALUM & MAG HYDROXIDE-SIMETH 200-200-20 MG/5ML PO SUSP
30.0000 mL | Freq: Once | ORAL | Status: AC
  Administered 2023-01-09: 30 mL via ORAL
  Filled 2023-01-09: qty 30

## 2023-01-09 NOTE — ED Notes (Signed)
Pt ambulated to the bathroom without assistance. 

## 2023-01-09 NOTE — Discharge Instructions (Signed)
You have been seen today for your complaint of abdominal pain. Your lab work was reassuring and showed no abnormalities. Your discharge medications include Protonix and Pepcid.  These medications that you have used in the past.  Follow dosing instructions on the packaging. I am also sending you a prescription for losartan which is the blood pressure medication you have taken in the past.  Take this once daily. Home care instructions are as follows:  Avoid spicy or acidic foods.  Sit straight upright for at least 30 minutes after eating Follow up with: Primary care.  I have placed a social work order and they should call you to help you get insurance for follow-up. Please seek immediate medical care if you develop any of the following symptoms: You vomit blood or a substance that looks like coffee grounds. You have black or dark red stools. You are unable to keep fluids down. At this time there does not appear to be the presence of an emergent medical condition, however there is always the potential for conditions to change. Please read and follow the below instructions.  Do not take your medicine if  develop an itchy rash, swelling in your mouth or lips, or difficulty breathing; call 911 and seek immediate emergency medical attention if this occurs.  You may review your lab tests and imaging results in their entirety on your MyChart account.  Please discuss all results of fully with your primary care provider and other specialist at your follow-up visit.  Note: Portions of this text may have been transcribed using voice recognition software. Every effort was made to ensure accuracy; however, inadvertent computerized transcription errors may still be present.

## 2023-01-09 NOTE — ED Triage Notes (Signed)
Pt presents to ED from  home C/O lower abdominal pain radiating to bilateral flanks. Endorses nausea and constipation, unsure when last BM was. Denies vomiting.

## 2023-01-09 NOTE — ED Provider Notes (Signed)
[place] EMERGENCY DEPARTMENT AT [facility] Provider Note   CSN: [id number] Arrival date & time: 01/09/23  0847     History  Chief Complaint  Patient presents with   Abdominal Pain    Eileen Bowen is a 47 y.o. female.  With a history of hypertension, diabetes, hypercholesterolemia, anxiety, depression, bipolar who presents to the ED for evaluation of epigastric abdominal pain, nausea and bilateral flank pain.  The symptoms have been present for approximately 6 to 7 months.  She states she was using Pepcid and Prevacid and had some improvement in her symptoms, but she does not have insurance and has not been able to use these medications since early December.  Her symptoms have gotten worse since that time.  She describes her pain as a burning sensation that gets worse when lying down and worse after eating.  Her nausea improves after eating.  She has not had any vomiting.  She states she was told that her symptoms may be caused by H. pylori.  She does not have a primary care provider to test for this due to lack of insurance.  Her bilateral flank pain is reported as mild and increases to moderate when she is standing for prolonged period of time.  She denies urinary symptoms, vaginal symptoms, lower abdominal pain, fevers, chills.  She states she has been feeling constipated lately but had a bowel movement this morning.  She is still passing gas.  She does have a history of 2 C-sections.  She is 1/2 pack/day smoker.  Denies alcohol use.  Smokes marijuana most days.   Abdominal Pain Associated symptoms: nausea        Home Medications Prior to Admission medications   Medication Sig Start Date End Date Taking? Authorizing Provider  famotidine (PEPCID) 20 MG tablet Take 1 tablet (20 mg total) by mouth 2 (two) times daily. 01/09/23  Yes Amal Saiki, [place] Folk, PA-C  losartan (COZAAR) 25 MG tablet Take 1 tablet (25 mg total) by mouth daily. 01/09/23  Yes Alee Katen, [place] Folk, PA-C   pantoprazole (PROTONIX) 20 MG tablet Take 1 tablet (20 mg total) by mouth daily. 01/09/23  Yes Anjolina Byrer, [place] Folk, PA-C  diclofenac (VOLTAREN) 75 MG EC tablet Take 1 tablet (75 mg total) by mouth 2 (two) times daily as needed. 08/06/21   Aundra Dubin, PA-C  lansoprazole (PREVACID) 30 MG capsule Take 1 capsule (30 mg total) by mouth 2 (two) times daily before a meal. 03/30/22 09/26/22  Lynden Oxford Scales, PA-C  pravastatin (PRAVACHOL) 20 MG tablet Take 1 tablet (20 mg total) by mouth daily. 09/07/21   Kerin Perna, NP      Allergies    Cashew nut oil    Review of Systems   Review of Systems  Gastrointestinal:  Positive for abdominal pain and nausea.  Genitourinary:  Positive for flank pain.  All other systems reviewed and are negative.   Physical Exam Updated Vital Signs BP (!) 143/107   Pulse 82   Temp 98.1 F (36.7 C)   Resp 18   Ht 5' 4"$  (1.626 m)   Wt 74.8 kg   SpO2 100%   BMI 28.32 kg/m  Physical Exam Vitals and nursing note reviewed.  Constitutional:      General: She is not in acute distress.    Appearance: She is well-developed.     Comments: Resting comfortably in bed  HENT:     Head: Normocephalic and atraumatic.  Eyes:  Conjunctiva/sclera: Conjunctivae normal.  Cardiovascular:     Rate and Rhythm: Normal rate and regular rhythm.     Heart sounds: No murmur heard. Pulmonary:     Effort: Pulmonary effort is normal. No respiratory distress.     Breath sounds: Normal breath sounds. No stridor. No wheezing, rhonchi or rales.  Abdominal:     Palpations: Abdomen is soft.     Tenderness: There is abdominal tenderness in the right upper quadrant, epigastric area and left upper quadrant. There is no right CVA tenderness, left CVA tenderness or guarding. Negative signs include Rovsing's sign, McBurney's sign, psoas sign and obturator sign.  Musculoskeletal:        General: No swelling.     Cervical back: Neck supple.  Skin:    General: Skin is warm  and dry.     Capillary Refill: Capillary refill takes less than 2 seconds.  Neurological:     General: No focal deficit present.     Mental Status: She is alert and oriented to person, place, and time.  Psychiatric:        Mood and Affect: Mood normal.     ED Results / Procedures / Treatments   Labs (all labs ordered are listed, but only abnormal results are displayed) Labs Reviewed  CBC WITH DIFFERENTIAL/PLATELET - Abnormal; Notable for the following components:      Result Value   MCV 102.1 (*)    All other components within normal limits  URINALYSIS, ROUTINE W REFLEX MICROSCOPIC - Abnormal; Notable for the following components:   Color, Urine STRAW (*)    All other components within normal limits  COMPREHENSIVE METABOLIC PANEL  LIPASE, BLOOD    EKG None  Radiology No results found.  Procedures Procedures    Medications Ordered in ED Medications  alum & mag hydroxide-simeth (MAALOX/MYLANTA) 200-200-20 MG/5ML suspension 30 mL (30 mLs Oral Given 01/09/23 0937)    And  lidocaine (XYLOCAINE) 2 % viscous mouth solution 15 mL (15 mLs Oral Given 01/09/23 [id number])    ED Course/ Medical Decision Making/ A&P                             Medical Decision Making This patient presents to the ED for concern of epigastric abdominal pain, bilateral flank pain, this involves an extensive number of treatment options, and is a complaint that carries with it a high risk of complications and morbidity.  Differential diagnosis of epigastric pain includes: Functional or nonulcer dyspepsia, PUD, GERD, Gastritis, (NSAIDs, alcohol, stress, H. pylori, pernicious anemia), pancreatitis or pancreatic cancer, overeating indigestion (high-fat foods, coffee), drugs (aspirin, antibiotics (eg, macrolides, metronidazole), corticosteroids, digoxin, narcotics, theophylline), gastroparesis, lactose intolerance, malabsorption gastric cancer, parasitic infection, (Giardia, Strongyloides, Ascaris) cholelithiasis,  choledocholithiasis, or cholangitis, ACS, pericarditis, pneumonia, abdominal hernia, pregnancy, intestinal ischemia, esophageal rupture, gastric volvulus, hepatitis.   Co morbidities that complicate the patient evaluation  hypertension, diabetes, hypercholesterolemia, anxiety, depression, bipolar  My initial workup includes abdominal pain labs, GI cocktail  Additional history obtained from: Nursing notes from this visit.  I ordered, reviewed and interpreted labs which include: CBC, CMP, lipase, urinalysis.  Labs unremarkable  Afebrile, slightly hypertensive which did improve while in the ED.  Otherwise hemodynamically stable.  47 year old female presenting to the ED for evaluation of what appears to be chronic epigastric abdominal pain and bilateral low back pain.  She has been treated for this successfully in the past but has not been taking her medications  due to loss of insurance.  On exam, she has some very mild epigastric abdominal tenderness to palpation.  There is no CVA tenderness.  She appears overall very well and is in no acute distress.  Her lab work was unremarkable.  Her symptoms did improve after GI cocktail.  She will be sent prescriptions for Protonix and Pepcid as she reported improvement with these medications in the past.  She also has not been taking her losartan due to loss of insurance.  Refill this will be sent.  Social work consult will be ordered due to patient's lack of insurance and lack of ability to obtain follow-up.  Overall I suspect patient has gastritis.  She was encouraged to avoid spicy and acidic foods and to stop smoking.  She was given return precautions.  Stable at discharge.  At this time there does not appear to be any evidence of an acute emergency medical condition and the patient appears stable for discharge with appropriate outpatient follow up. Diagnosis was discussed with patient who verbalizes understanding of care plan and is agreeable to discharge. I  have discussed return precautions with patient who verbalizes understanding. Patient encouraged to follow-up with their PCP within 1 week. All questions answered.  Note: Portions of this report may have been transcribed using voice recognition software. Every effort was made to ensure accuracy; however, inadvertent computerized transcription errors may still be present.        Final Clinical Impression(s) / ED Diagnoses Final diagnoses:  Chronic gastritis without bleeding, unspecified gastritis type  Uncontrolled hypertension    Rx / DC Orders ED Discharge Orders          Ordered    pantoprazole (PROTONIX) 20 MG tablet  Daily        01/09/23 1044    famotidine (PEPCID) 20 MG tablet  2 times daily        01/09/23 1044    losartan (COZAAR) 25 MG tablet  Daily        01/09/23 1044    Ambulatory referral to Social Work       Comments: Needs insurance   01/09/23 [id number] Evelena Leyden 01/09/23 [place], MD 01/12/23 1526

## 2023-02-16 ENCOUNTER — Ambulatory Visit (HOSPITAL_COMMUNITY): Payer: Self-pay

## 2023-02-16 ENCOUNTER — Ambulatory Visit (INDEPENDENT_AMBULATORY_CARE_PROVIDER_SITE_OTHER): Payer: Self-pay

## 2023-02-16 ENCOUNTER — Ambulatory Visit
Admission: EM | Admit: 2023-02-16 | Discharge: 2023-02-16 | Disposition: A | Discharge status: Home / Self Care | Payer: Self-pay | Attending: Urgent Care | Admitting: Urgent Care

## 2023-02-16 DIAGNOSIS — M5030 Other cervical disc degeneration, unspecified cervical region: Secondary | ICD-10-CM

## 2023-02-16 DIAGNOSIS — M542 Cervicalgia: Secondary | ICD-10-CM

## 2023-02-16 DIAGNOSIS — I10 Essential (primary) hypertension: Secondary | ICD-10-CM

## 2023-02-16 DIAGNOSIS — M5412 Radiculopathy, cervical region: Secondary | ICD-10-CM

## 2023-02-16 DIAGNOSIS — R519 Headache, unspecified: Secondary | ICD-10-CM

## 2023-02-16 MED ORDER — PREDNISONE 50 MG PO TABS: 50.0000 mg | ORAL_TABLET | Freq: Every day | ORAL | 0 refills | Status: AC

## 2023-02-16 MED ORDER — CYCLOBENZAPRINE HCL 5 MG PO TABS: 5.0000 mg | ORAL_TABLET | Freq: Three times a day (TID) | ORAL | 0 refills | Status: AC | PRN

## 2023-02-16 NOTE — ED Provider Notes (Signed)
[facility]  Note:  This document was prepared using Systems analyst and may include unintentional dictation errors.  MRN: [id number] DOB: [birth year]  Subjective:   Eileen Bowen is a 47 y.o. female presenting for 1 week history of acute onset persistent moderate to severe neck pain with radicular symptoms going down the right side of her shoulder, arm and the right thoracic back.  Symptoms can be elicited with rotating her head toward the left.  No fall, trauma.  No numbness or tingling.  Patient usually does better with prednisone, steroids.  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  No current facility-administered medications for this encounter.  Current Outpatient Medications:    diclofenac (VOLTAREN) 75 MG EC tablet, Take 1 tablet (75 mg total) by mouth 2 (two) times daily as needed., Disp: 60 tablet, Rfl: 2   famotidine (PEPCID) 20 MG tablet, Take 1 tablet (20 mg total) by mouth 2 (two) times daily., Disp: 30 tablet, Rfl: 0   lansoprazole (PREVACID) 30 MG capsule, Take 1 capsule (30 mg total) by mouth 2 (two) times daily before a meal., Disp: 60 capsule, Rfl: 5   losartan (COZAAR) 25 MG tablet, Take 1 tablet (25 mg total) by mouth daily., Disp: 30 tablet, Rfl: 2   pantoprazole (PROTONIX) 20 MG tablet, Take 1 tablet (20 mg total) by mouth daily., Disp: 30 tablet, Rfl: 0   pravastatin (PRAVACHOL) 20 MG tablet, Take 1 tablet (20 mg total) by mouth daily., Disp: 90 tablet, Rfl: 1   Allergies  Allergen Reactions   Cashew Nut Oil     Past Medical History:  Diagnosis Date   Anxiety    Bipolar 1 disorder ([place])    Depression    Diabetes mellitus    Dysmenorrhea    Fibromyalgia    Genital warts    Hypercholesteremia    Hypertension    Migraine    Raynaud's disease      Past Surgical History:  Procedure Laterality Date    CESAREAN SECTION     CHOLECYSTECTOMY  09/1999   COLPOSCOPY     TUBAL LIGATION      Family History  Problem Relation Age of Onset   Diabetes Mother    Fibromyalgia Sister    Hypertension Maternal Aunt    Thyroid disease Maternal Grandmother    Asthma Son     Social History   Tobacco Use   Smoking status: Every Day    Packs/day: 0.50    Years: 21.00    Additional pack years: 0.00    Total pack years: 10.50    Types: Cigarettes   Smokeless tobacco: Never  Vaping Use   Vaping Use: Never used  Substance Use Topics   Alcohol use: Yes    Alcohol/week: 1.0 standard drink of alcohol    Types: 1 Standard drinks or equivalent per week   Drug use: No    ROS   Objective:   Vitals: BP 135/86 (BP Location: Right Arm)   Pulse 96   Temp 98 F (36.7 C) (Oral)   Resp 16   LMP 01/28/2023 (Approximate)   SpO2 96%   Physical Exam Constitutional:      General: She is not in acute distress.    Appearance: Normal appearance. She is well-developed. She is not ill-appearing, toxic-appearing or diaphoretic.  HENT:  Head: Normocephalic and atraumatic.     Right Ear: External ear normal.     Left Ear: External ear normal.     Nose: Nose normal.     Mouth/Throat:     Mouth: Mucous membranes are moist.  Eyes:     General: No scleral icterus.       Right eye: No discharge.        Left eye: No discharge.     Extraocular Movements: Extraocular movements intact.  Cardiovascular:     Rate and Rhythm: Normal rate.  Pulmonary:     Effort: Pulmonary effort is normal.  Musculoskeletal:     Cervical back: Normal range of motion and neck supple. Spasms and tenderness (positive Spurling maneuver to the right; tenderness across paraspinal muscles on the right, right trapezius) present. No swelling, edema, deformity, erythema, signs of trauma, lacerations, rigidity, torticollis, bony tenderness or crepitus. Pain with movement present. No spinous process tenderness or muscular tenderness.  Normal range of motion.     Thoracic back: No swelling, edema, deformity, signs of trauma, lacerations, spasms, tenderness or bony tenderness. Normal range of motion. No scoliosis.  Skin:    General: Skin is warm and dry.  Neurological:     General: No focal deficit present.     Mental Status: She is alert and oriented to person, place, and time.     Cranial Nerves: No cranial nerve deficit.     Motor: No weakness.     Coordination: Coordination normal.     Gait: Gait normal.     Deep Tendon Reflexes: Reflexes normal.  Psychiatric:        Mood and Affect: Mood normal.        Behavior: Behavior normal.    DG Cervical Spine Complete  Result Date: 02/16/2023 CLINICAL DATA:  Neck pain. EXAM: CERVICAL SPINE - COMPLETE 4+ VIEW COMPARISON:  None Available. FINDINGS: C1 to the superior endplate of T1 is imaged on the provided lateral radiograph. There is straightening of the expected cervical lordosis. No anterolisthesis or retrolisthesis. The bilateral facets appear normally aligned. The head is held in a minimal amount of left lateral flexion. The dens is normally positioned between the lateral masses of C1. Cervical vertebral body heights are preserved. Prevertebral soft tissues appear normal. Mild to moderate multilevel cervical spine DDD, worse at C4-C5, and to a lesser extent, C3-C4, with disc space height loss, endplate irregularity and sclerosis. The bilateral neural foramina appear patent given obliquity. Regional soft tissues appear normal. IMPRESSION: Mild-to-moderate multilevel cervical spine DDD, worse at C4-C5. Electronically Signed   By: Sandi Mariscal M.D.   On: 02/16/2023 12:55    Assessment and Plan :   PDMP not reviewed this encounter.  1. Cervical radiculopathy   2. Neck pain   3. Essential hypertension   4. Generalized headaches   5. Degenerative disc disease, cervical     Patient will undergo prednisone course especially given the degenerative disc disease, the radicular  symptoms.  Follow-up with her primary care provider, provided her with information for this.  Use muscle relaxant as needed.  Monitor blood pressure especially while using the prednisone.  Counseled patient on potential for adverse effects with medications prescribed/recommended today, ER and return-to-clinic precautions discussed, patient verbalized understanding.    Jaynee Eagles, [place] 02/16/23 1306

## 2023-02-16 NOTE — ED Triage Notes (Signed)
Pt states neck and back pain for the past week.  States when she moves her neck she feels pulling in her back.  Has tried OTC meds with no relief.

## 2023-08-19 ENCOUNTER — Ambulatory Visit
Admission: EM | Admit: 2023-08-19 | Discharge: 2023-08-19 | Disposition: A | Discharge status: Home / Self Care | Payer: Medicaid Other | Attending: Physician Assistant | Admitting: Physician Assistant

## 2023-08-19 DIAGNOSIS — K219 Gastro-esophageal reflux disease without esophagitis: Secondary | ICD-10-CM

## 2023-08-19 DIAGNOSIS — I10 Essential (primary) hypertension: Secondary | ICD-10-CM

## 2023-08-19 DIAGNOSIS — K921 Melena: Secondary | ICD-10-CM

## 2023-08-19 MED ORDER — PANTOPRAZOLE SODIUM 40 MG PO TBEC: 40.0000 mg | DELAYED_RELEASE_TABLET | Freq: Every day | ORAL | 0 refills | Status: AC

## 2023-08-19 MED ORDER — AMLODIPINE BESYLATE 5 MG PO TABS: 5.0000 mg | ORAL_TABLET | Freq: Every day | ORAL | 0 refills | Status: AC

## 2023-08-19 MED ORDER — SUCRALFATE 1 G PO TABS: 1.0000 g | ORAL_TABLET | Freq: Four times a day (QID) | ORAL | 0 refills | Status: AC

## 2023-08-19 NOTE — ED Provider Notes (Signed)
[facility]    CSN: [id number] Arrival date & time: 08/19/23  0900      History   Chief Complaint Chief Complaint  Patient presents with   Rectal Bleeding    HPI Eileen Bowen is a 47 y.o. female.   Patient here for evaluation of "blood in stool" x 5 days.  She reports history of GERD, epigastric pain x decades.  She has not seen PCP or GI due to financial / insurance concerns.  She endorses 11 pound weight loss in the last 2 weeks.  She reports occasional NSAID use, though not very frequently.  She does smoke, she denies alcohol use.  Her mom recently dx with rectal cancer.  She is concerned she has H. Pylori, but is unable to see PCP for breath urea test as advised when she went to ED 7 months ago for similar epigastric pain.  She admits bloating, excess gas, gastric reflux, epigastric pain.  She denies melena, fatigue, n/v/d/c.  She denies straining to go to the bathroom, rectal pain.  She is taking fiber supplements, vitamins, probiotics w/o relief.  She reports she had history of hemorrhoids 24 years ago when she was pregnant, none since.  Blood pressure elevated today.  Patient has been without HTN medication x months due to lack of insurance coverage.  She reports HA, no chest pain ,SOB, LE edema.    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 Authorizing Provider  amLODipine (NORVASC) 5 MG tablet Take 1 tablet (5 mg total) by mouth daily. 08/19/23  Yes Evern Core, PA-C  pantoprazole (PROTONIX) 40 MG tablet Take 1 tablet (40 mg total) by mouth daily. 08/19/23  Yes Evern Core, PA-C  sucralfate (CARAFATE) 1 g tablet Take 1 tablet (1 g total) by mouth 4 (four) times daily. 08/19/23 09/18/23 Yes Evern Core, PA-C  cyclobenzaprine (FLEXERIL) 5 MG tablet Take 1 tablet (5 mg total) by mouth 3 (three) times daily as needed for muscle spasms. 02/16/23  Wallis Bamberg, PA-C  diclofenac (VOLTAREN) 75 MG EC tablet Take 1 tablet (75 mg total) by mouth 2 (two) times daily as needed. 08/06/21   Cristie Hem, PA-C  famotidine (PEPCID) 20 MG tablet Take 1 tablet (20 mg total) by mouth 2 (two) times daily. 01/09/23   Schutt, Edsel Petrin, PA-C  lansoprazole (PREVACID) 30 MG capsule Take 1 capsule (30 mg total) by mouth 2 (two) times daily before a meal. 03/30/22 09/26/22  Theadora Rama Scales, PA-C  losartan (COZAAR) 25 MG tablet Take 1 tablet (25 mg total) by mouth daily. 01/09/23   Schutt, Edsel Petrin, PA-C  pantoprazole (PROTONIX) 20 MG tablet Take 1 tablet (20 mg total) by mouth daily. 01/09/23   Schutt, Edsel Petrin, PA-C  pravastatin (PRAVACHOL) 20 MG tablet Take 1 tablet (20 mg total) by mouth daily. 09/07/21   [name], NP  predniSONE (DELTASONE) 50 MG tablet Take 1 tablet (50 mg total) by mouth daily with breakfast. 02/16/23   Wallis Bamberg, PA-C    Family History Family History  Problem Relation Age of Onset   Diabetes Mother     Fibromyalgia Sister    Hypertension Maternal Aunt    Thyroid disease Maternal Grandmother    Asthma Son     Social History Social History   Tobacco Use   Smoking status: Every Day    Current packs/day: 0.50    Average packs/day: 0.5 packs/day for 21.0 years (10.5 ttl pk-yrs)    Types: Cigarettes   Smokeless tobacco: Never  Vaping Use   Vaping status: Never Used  Substance Use Topics   Alcohol use: Yes    Alcohol/week: 1.0 standard drink of alcohol    Types: 1 Standard drinks or equivalent per week   Drug use: No     Allergies   Cashew nut oil   Review of Systems Review of Systems  Constitutional:  Positive for unexpected weight change. Negative for fatigue and fever.  Respiratory:  Negative for cough and shortness of breath.   Cardiovascular:  Negative for chest pain.  Gastrointestinal:  Positive for abdominal distention, abdominal pain and blood in stool. Negative for anal bleeding, constipation, diarrhea, nausea, rectal pain and vomiting.  Musculoskeletal:  Negative for arthralgias and myalgias.  Skin:  Negative for color change, rash and wound.  Hematological:  Negative for adenopathy. Does not bruise/bleed easily.  Psychiatric/Behavioral:  Negative for sleep disturbance.      Physical Exam Triage Vital Signs ED Triage Vitals  Encounter Vitals Group     BP 08/19/23 0907 (!) 145/100     Systolic BP Percentile --      Diastolic BP Percentile --      Pulse Rate 08/19/23 0907 85     Resp 08/19/23 0907 16     Temp 08/19/23 0907 98.2 F (36.8 C)     Temp Source 08/19/23 0907 Oral     SpO2 08/19/23 0907 97 %     Weight --      Height --      Head Circumference --      Peak Flow --      Pain Score 08/19/23 0908 0     Pain Loc --      Pain Education --      Exclude from Growth Chart --    No data found.  Updated Vital Signs BP (!) 145/100 (BP Location: Right Arm)   Pulse 85   Temp 98.2 F (36.8 C) (Oral)   Resp 16  LMP 08/05/2023 (Exact Date)    SpO2 97%   Visual Acuity Right Eye Distance:   Left Eye Distance:   Bilateral Distance:    Right Eye Near:   Left Eye Near:    Bilateral Near:     Physical Exam Vitals and nursing note reviewed.  Constitutional:      General: She is not in acute distress.    Appearance: Normal appearance. She is not ill-appearing.  HENT:     Head: Normocephalic and atraumatic.     Nose: No congestion or rhinorrhea.     Mouth/Throat:     Pharynx: No oropharyngeal exudate or posterior oropharyngeal erythema.  Eyes:     General: No scleral icterus.    Extraocular Movements: Extraocular movements intact.     Conjunctiva/sclera: Conjunctivae normal.  Pulmonary:     Effort: Pulmonary effort is normal. No respiratory distress.  Abdominal:     General: There is no distension.     Tenderness: There is abdominal tenderness in the epigastric area and suprapubic area. There is no right CVA tenderness, left CVA tenderness, guarding or rebound. Negative signs include Murphy's sign, Rovsing's sign, McBurney's sign and obturator sign.  Musculoskeletal:     Cervical back: Normal range of motion. No rigidity.  Skin:    Coloration: Skin is not jaundiced.     Findings: No rash.  Neurological:     General: No focal deficit present.     Mental Status: She is alert and oriented to person, place, and time.     Motor: No weakness.     Gait: Gait normal.  Psychiatric:        Mood and Affect: Mood normal.        Behavior: Behavior normal.      UC Treatments / Results  Labs (all labs ordered are listed, but only abnormal results are displayed) Labs Reviewed  CBC  COMPREHENSIVE METABOLIC PANEL  H. PYLORI ANTIBODY, IGG    EKG   Radiology No results found.  Procedures Procedures (including critical care time)  Medications Ordered in UC Medications - No data to display  Initial Impression / Assessment and Plan / UC Course  I have reviewed the triage vital signs and the nursing notes.  Pertinent  labs & imaging results that were available during my care of the patient were reviewed by me and considered in my medical decision making (see chart for details).     Patient needs to follow up with PCP and GI Patient really concerned about H. Pylori, but unable to get Urea breath test.  I will order H. Pylori antigen test.  I discussed limitations of test in detail with patient, and that as an urgent care, we may not be able to treat, if test is positive.  I also explained her insurance may not cover the cost of the test.  Patient stated, "I don't care, I just need help."   I will start her on amlodipine for HTN Final Clinical Impressions(s) / UC Diagnoses   Final diagnoses:  Bloody stool  Gastroesophageal reflux disease without esophagitis  Hypertension, unspecified type   Discharge Instructions   None    ED Prescriptions     Medication Sig Dispense Auth. Provider   amLODipine (NORVASC) 5 MG tablet Take 1 tablet (5 mg total) by mouth daily. 30 tablet Evern Core, PA-C   pantoprazole (PROTONIX) 40 MG tablet Take 1 tablet (40 mg total) by mouth daily. 30 tablet Evern Core, PA-C   sucralfate (CARAFATE) 1  g tablet Take 1 tablet (1 g total) by mouth 4 (four) times daily. 120 tablet Evern Core, PA-C      PDMP not reviewed this encounter.   Evern Core, PA-C 08/19/23 [phone]

## 2023-08-19 NOTE — ED Triage Notes (Signed)
Pt states blood in her stool for the past 5 days.

## 2023-08-20 LAB — COMPREHENSIVE METABOLIC PANEL
ALT: 13 IU/L (ref 0–32)
AST: 27 IU/L (ref 0–40)
Albumin: 3.8 g/dL — ABNORMAL LOW (ref 3.9–4.9)
Alkaline Phosphatase: 96 IU/L (ref 44–121)
BUN/Creatinine Ratio: 16 (ref 9–23)
BUN: 9 mg/dL (ref 6–24)
Bilirubin Total: <0.2 mg/dL (ref 0.0–1.2)
CO2: 23 mmol/L (ref 20–29)
Calcium: 8.8 mg/dL (ref 8.7–10.2)
Chloride: 104 mmol/L (ref 96–106)
Creatinine, Ser: 0.56 mg/dL — ABNORMAL LOW (ref 0.57–1.00)
Globulin, Total: 3.7 g/dL (ref 1.5–4.5)
Glucose: 94 mg/dL (ref 70–99)
Potassium: 4.7 mmol/L (ref 3.5–5.2)
Sodium: 139 mmol/L (ref 134–144)
Total Protein: 7.5 g/dL (ref 6.0–8.5)
eGFR: 112 mL/min/{1.73_m2} (ref >59)

## 2023-08-20 LAB — CBC
Hematocrit: 39.6 % (ref 34.0–46.6)
Hemoglobin: 12.7 g/dL (ref 11.1–15.9)
MCH: 33 pg (ref 26.6–33.0)
MCHC: 32.1 g/dL (ref 31.5–35.7)
MCV: 103 fL — ABNORMAL HIGH (ref 79–97)
Platelets: 404 10*3/uL (ref 150–450)
RBC: 3.85 x10E6/uL (ref 3.77–5.28)
RDW: 12.9 % (ref 11.7–15.4)
WBC: 5.7 10*3/uL (ref 3.4–10.8)
